# Patient Record
Sex: Female | Born: 1970 | Race: White | Hispanic: No | Smoking: Never smoker
Health system: Southern US, Community
[De-identification: ages and names within clinical notes are randomized; demographics above are authoritative.]

## PROBLEM LIST (undated history)

## (undated) DIAGNOSIS — T4145XA Adverse effect of unspecified anesthetic, initial encounter: Secondary | ICD-10-CM

## (undated) DIAGNOSIS — F329 Major depressive disorder, single episode, unspecified: Secondary | ICD-10-CM

## (undated) DIAGNOSIS — E282 Polycystic ovarian syndrome: Secondary | ICD-10-CM

## (undated) DIAGNOSIS — F419 Anxiety disorder, unspecified: Secondary | ICD-10-CM

## (undated) DIAGNOSIS — T8859XA Other complications of anesthesia, initial encounter: Secondary | ICD-10-CM

## (undated) DIAGNOSIS — J189 Pneumonia, unspecified organism: Secondary | ICD-10-CM

## (undated) DIAGNOSIS — R519 Headache, unspecified: Secondary | ICD-10-CM

## (undated) DIAGNOSIS — M797 Fibromyalgia: Secondary | ICD-10-CM

## (undated) DIAGNOSIS — F32A Depression, unspecified: Secondary | ICD-10-CM

## (undated) DIAGNOSIS — Z87442 Personal history of urinary calculi: Secondary | ICD-10-CM

## (undated) HISTORY — PX: GASTRIC BYPASS: SHX52

## (undated) HISTORY — PX: DILATION AND CURETTAGE OF UTERUS: SHX78

---
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
## 2008-09-12 ENCOUNTER — Ambulatory Visit: Payer: Self-pay | Admitting: Diagnostic Radiology

## 2008-09-12 ENCOUNTER — Emergency Department (HOSPITAL_BASED_OUTPATIENT_CLINIC_OR_DEPARTMENT_OTHER): Admission: EM | Admit: 2008-09-12 | Discharge: 2008-09-12 | Payer: Self-pay | Admitting: Emergency Medicine

## 2009-04-05 HISTORY — PX: ROUX-EN-Y GASTRIC BYPASS: SHX1104

## 2009-08-04 ENCOUNTER — Ambulatory Visit (HOSPITAL_COMMUNITY): Admission: RE | Admit: 2009-08-04 | Discharge: 2009-08-04 | Payer: Self-pay | Admitting: Surgery

## 2009-08-08 ENCOUNTER — Ambulatory Visit (HOSPITAL_COMMUNITY): Admission: RE | Admit: 2009-08-08 | Discharge: 2009-08-08 | Payer: Self-pay | Admitting: Surgery

## 2009-09-11 ENCOUNTER — Encounter: Admission: RE | Admit: 2009-09-11 | Discharge: 2009-09-11 | Payer: Self-pay | Admitting: Surgery

## 2010-07-13 LAB — POCT CARDIAC MARKERS
Myoglobin, poc: 35 ng/mL (ref 12–200)
Troponin i, poc: <0.05 ng/mL (ref 0.00–0.09)

## 2010-07-13 LAB — URINALYSIS, ROUTINE W REFLEX MICROSCOPIC
Ketones, ur: NEGATIVE mg/dL
Nitrite: NEGATIVE
Specific Gravity, Urine: 1.024 (ref 1.005–1.030)

## 2010-07-13 LAB — BASIC METABOLIC PANEL
Calcium: 9.3 mg/dL (ref 8.4–10.5)
Chloride: 102 mEq/L (ref 96–112)
GFR calc non Af Amer: >60 mL/min (ref >60)
Glucose, Bld: 99 mg/dL (ref 70–99)
Potassium: 4.2 mEq/L (ref 3.5–5.1)
Sodium: 142 mEq/L (ref 135–145)

## 2010-07-13 LAB — DIFFERENTIAL
Basophils Absolute: 0 10*3/uL (ref 0.0–0.1)
Basophils Relative: 0 % (ref 0–1)
Eosinophils Absolute: 0.2 10*3/uL (ref 0.0–0.7)
Lymphocytes Relative: 26 % (ref 12–46)
Lymphs Abs: 1.8 10*3/uL (ref 0.7–4.0)
Monocytes Relative: 4 % (ref 3–12)
Neutrophils Relative %: 67 % (ref 43–77)

## 2010-07-13 LAB — CBC
Hemoglobin: 15.2 g/dL — ABNORMAL HIGH (ref 12.0–15.0)
MCHC: 35 g/dL (ref 30.0–36.0)
RBC: 4.74 MIL/uL (ref 3.87–5.11)

## 2010-07-13 LAB — PREGNANCY, URINE: Preg Test, Ur: NEGATIVE

## 2010-08-19 ENCOUNTER — Other Ambulatory Visit: Payer: Self-pay | Admitting: Family Medicine

## 2010-08-19 DIAGNOSIS — N63 Unspecified lump in unspecified breast: Secondary | ICD-10-CM

## 2010-08-24 ENCOUNTER — Ambulatory Visit
Admission: RE | Admit: 2010-08-24 | Discharge: 2010-08-24 | Disposition: A | Discharge status: Home / Self Care | Payer: BC Managed Care – PPO | Source: Ambulatory Visit | Attending: Family Medicine | Admitting: Family Medicine

## 2010-08-24 ENCOUNTER — Other Ambulatory Visit: Payer: Self-pay | Admitting: Family Medicine

## 2010-08-24 DIAGNOSIS — N63 Unspecified lump in unspecified breast: Secondary | ICD-10-CM

## 2011-03-03 ENCOUNTER — Other Ambulatory Visit: Payer: Self-pay | Admitting: Family Medicine

## 2011-03-03 DIAGNOSIS — M25512 Pain in left shoulder: Secondary | ICD-10-CM

## 2011-03-06 ENCOUNTER — Other Ambulatory Visit: Payer: BC Managed Care – PPO

## 2011-03-08 ENCOUNTER — Ambulatory Visit
Admission: RE | Admit: 2011-03-08 | Discharge: 2011-03-08 | Disposition: A | Discharge status: Home / Self Care | Payer: BC Managed Care – PPO | Source: Ambulatory Visit | Attending: Family Medicine | Admitting: Family Medicine

## 2011-03-08 DIAGNOSIS — M25512 Pain in left shoulder: Secondary | ICD-10-CM

## 2014-09-10 ENCOUNTER — Ambulatory Visit (HOSPITAL_COMMUNITY)
Admission: RE | Admit: 2014-09-10 | Discharge: 2014-09-10 | Disposition: A | Discharge status: Home / Self Care | Payer: BLUE CROSS/BLUE SHIELD | Source: Ambulatory Visit | Attending: Family Medicine | Admitting: Family Medicine

## 2014-09-10 ENCOUNTER — Other Ambulatory Visit (HOSPITAL_COMMUNITY): Payer: Self-pay | Admitting: Family Medicine

## 2014-09-10 DIAGNOSIS — H532 Diplopia: Secondary | ICD-10-CM

## 2014-09-10 DIAGNOSIS — R42 Dizziness and giddiness: Secondary | ICD-10-CM | POA: Insufficient documentation

## 2019-04-04 ENCOUNTER — Emergency Department (HOSPITAL_COMMUNITY)
Admission: EM | Admit: 2019-04-04 | Discharge: 2019-04-05 | Disposition: A | Discharge status: Home / Self Care | Payer: BC Managed Care – PPO | Source: Home / Self Care | Attending: Emergency Medicine | Admitting: Emergency Medicine

## 2019-04-04 ENCOUNTER — Other Ambulatory Visit: Payer: Self-pay

## 2019-04-04 ENCOUNTER — Encounter (HOSPITAL_COMMUNITY): Payer: Self-pay

## 2019-04-04 ENCOUNTER — Emergency Department (HOSPITAL_COMMUNITY): Payer: BC Managed Care – PPO

## 2019-04-04 DIAGNOSIS — N201 Calculus of ureter: Secondary | ICD-10-CM

## 2019-04-04 DIAGNOSIS — R109 Unspecified abdominal pain: Secondary | ICD-10-CM | POA: Diagnosis present

## 2019-04-04 DIAGNOSIS — Z9884 Bariatric surgery status: Secondary | ICD-10-CM | POA: Diagnosis not present

## 2019-04-04 DIAGNOSIS — Z6841 Body Mass Index (BMI) 40.0 and over, adult: Secondary | ICD-10-CM | POA: Diagnosis not present

## 2019-04-04 DIAGNOSIS — E119 Type 2 diabetes mellitus without complications: Secondary | ICD-10-CM | POA: Diagnosis not present

## 2019-04-04 DIAGNOSIS — I10 Essential (primary) hypertension: Secondary | ICD-10-CM | POA: Diagnosis not present

## 2019-04-04 LAB — URINALYSIS, ROUTINE W REFLEX MICROSCOPIC
Bacteria, UA: NONE SEEN
Bilirubin Urine: NEGATIVE
Glucose, UA: NEGATIVE mg/dL
Ketones, ur: 5 mg/dL — AB
Leukocytes,Ua: NEGATIVE
Nitrite: NEGATIVE
Protein, ur: NEGATIVE mg/dL
Specific Gravity, Urine: >1.046 — ABNORMAL HIGH (ref 1.005–1.030)
pH: 5 (ref 5.0–8.0)

## 2019-04-04 LAB — COMPREHENSIVE METABOLIC PANEL
ALT: 18 U/L (ref 0–44)
AST: 22 U/L (ref 15–41)
Albumin: 4 g/dL (ref 3.5–5.0)
Alkaline Phosphatase: 55 U/L (ref 38–126)
Anion gap: 10 (ref 5–15)
BUN: 10 mg/dL (ref 6–20)
CO2: 22 mmol/L (ref 22–32)
Calcium: 9 mg/dL (ref 8.9–10.3)
Chloride: 107 mmol/L (ref 98–111)
Creatinine, Ser: 0.89 mg/dL (ref 0.44–1.00)
GFR calc Af Amer: >60 mL/min (ref >60)
GFR calc non Af Amer: >60 mL/min (ref >60)
Glucose, Bld: 149 mg/dL — ABNORMAL HIGH (ref 70–99)
Potassium: 4 mmol/L (ref 3.5–5.1)
Sodium: 139 mmol/L (ref 135–145)
Total Bilirubin: 1 mg/dL (ref 0.3–1.2)
Total Protein: 6.8 g/dL (ref 6.5–8.1)

## 2019-04-04 LAB — CBC
HCT: 40.6 % (ref 36.0–46.0)
Hemoglobin: 13.9 g/dL (ref 12.0–15.0)
MCH: 31.2 pg (ref 26.0–34.0)
MCHC: 34.2 g/dL (ref 30.0–36.0)
MCV: 91.2 fL (ref 80.0–100.0)
Platelets: 313 10*3/uL (ref 150–400)
RBC: 4.45 MIL/uL (ref 3.87–5.11)
RDW: 12.8 % (ref 11.5–15.5)
WBC: 10.7 10*3/uL — ABNORMAL HIGH (ref 4.0–10.5)
nRBC: 0 % (ref 0.0–0.2)

## 2019-04-04 LAB — LIPASE, BLOOD: Lipase: 23 U/L (ref 11–51)

## 2019-04-04 LAB — I-STAT BETA HCG BLOOD, ED (MC, WL, AP ONLY): I-stat hCG, quantitative: <5 m[IU]/mL (ref <5)

## 2019-04-04 MED ORDER — OXYCODONE-ACETAMINOPHEN 5-325 MG PO TABS: 1.0000 | ORAL_TABLET | Freq: Four times a day (QID) | ORAL | 0 refills | Status: DC | PRN

## 2019-04-04 MED ORDER — SODIUM CHLORIDE 0.9 % IV BOLUS
1000.0000 mL | Freq: Once | INTRAVENOUS | Status: AC
  Administered 2019-04-04: 1000 mL via INTRAVENOUS

## 2019-04-04 MED ORDER — ONDANSETRON 4 MG PO TBDP: 4.0000 mg | ORAL_TABLET | Freq: Three times a day (TID) | ORAL | 0 refills | Status: DC | PRN

## 2019-04-04 MED ORDER — ONDANSETRON HCL 4 MG/2ML IJ SOLN
4.0000 mg | Freq: Once | INTRAMUSCULAR | Status: AC
  Administered 2019-04-04: 22:00:00 4 mg via INTRAVENOUS
  Filled 2019-04-04: qty 2

## 2019-04-04 MED ORDER — KETOROLAC TROMETHAMINE 30 MG/ML IJ SOLN
30.0000 mg | Freq: Once | INTRAMUSCULAR | Status: AC
  Administered 2019-04-05: 30 mg via INTRAVENOUS
  Filled 2019-04-04: qty 1

## 2019-04-04 MED ORDER — IOHEXOL 300 MG/ML  SOLN
100.0000 mL | Freq: Once | INTRAMUSCULAR | Status: AC | PRN
  Administered 2019-04-04: 22:00:00 100 mL via INTRAVENOUS

## 2019-04-04 MED ORDER — TAMSULOSIN HCL 0.4 MG PO CAPS: 0.4000 mg | ORAL_CAPSULE | Freq: Every day | ORAL | 0 refills | Status: DC

## 2019-04-04 MED ORDER — OXYCODONE-ACETAMINOPHEN 5-325 MG PO TABS
1.0000 | ORAL_TABLET | ORAL | Status: DC | PRN
  Administered 2019-04-04: 1 via ORAL
  Filled 2019-04-04: qty 1

## 2019-04-04 MED ORDER — MORPHINE SULFATE (PF) 4 MG/ML IV SOLN
4.0000 mg | Freq: Once | INTRAVENOUS | Status: AC
  Administered 2019-04-04: 4 mg via INTRAVENOUS
  Filled 2019-04-04: qty 1

## 2019-04-04 NOTE — ED Provider Notes (Signed)
[facility] Provider Note   CSN: [id number] Arrival date & time: 04/04/19  1511     History Chief Complaint  Patient presents with  . Abdominal Pain    Nancy Cordova is a 48 y.o. female with past medical history of Roux-en-Y gastric bypass in 2011, presenting to the emergency department with complaint of left-sided abdominal pain that began suddenly this morning.  She states she had a brief episode a few days ago that only lasted a couple of hours that was across her abdomen however resolved.  She states today she had sudden onset of left flank and left mid abdomen pain that feels like a "cross between menstrual cramp and muscle cramp."  She states the pain has been constant and severe.  It has improved with Percocet that was administered in triage.  She reports associated nausea and vomiting as well as diarrhea occurred this morning.  She states she is not passed any flatus since the episode of diarrhea this morning.  She endorses chills though no fever.  She endorses increased frequency though no dysuria.  No history of nephrolithiasis or diverticulitis.  The history is provided by the patient.       History reviewed. No pertinent past medical history.  There are no problems to display for this patient.   Past Surgical History:  Procedure Laterality Date  . GASTRIC BYPASS       OB History   No obstetric history on file.     History reviewed. No pertinent family history.  Social History   Tobacco Use  . Smoking status: Not on file  Substance Use Topics  . Alcohol use: Not on file  . Drug use: Not on file    Home Medications Prior to Admission medications   Medication Sig Start Date End Date Taking? Authorizing Provider  ondansetron (ZOFRAN ODT) 4 MG disintegrating tablet Take 1 tablet (4 mg total) by mouth every 8 (eight) hours as needed for nausea or vomiting. 04/04/19   Nayelly Laughman, [place] N, PA-C  oxyCODONE-acetaminophen  (PERCOCET/ROXICET) 5-325 MG tablet Take 1 tablet by mouth every 6 (six) hours as needed for severe pain. 04/04/19   Jorryn Casagrande, [place] N, PA-C  tamsulosin (FLOMAX) 0.4 MG CAPS capsule Take 1 capsule (0.4 mg total) by mouth daily. 04/04/19   Jensyn Shave, [place] N, PA-C    Allergies    Patient has no known allergies.  Review of Systems   Review of Systems  Constitutional: Negative for fever.  Gastrointestinal: Positive for abdominal pain, diarrhea, nausea and vomiting.  Genitourinary: Positive for frequency.  All other systems reviewed and are negative.   Physical Exam Updated Vital Signs BP 126/68   Pulse 85   Temp 98.8 F (37.1 C) (Oral)   Resp 14   Ht 5\' 5"  (1.651 m)   Wt 108.9 kg   SpO2 100%   BMI 39.94 kg/m   Physical Exam Vitals and nursing note reviewed.  Constitutional:      General: She is not in acute distress.    Appearance: She is well-developed. She is not ill-appearing.  HENT:     Head: Normocephalic and atraumatic.  Eyes:     Conjunctiva/sclera: Conjunctivae normal.  Cardiovascular:     Rate and Rhythm: Normal rate and regular rhythm.  Pulmonary:     Effort: Pulmonary effort is normal. No respiratory distress.     Breath sounds: Normal breath sounds.  Abdominal:     General: Bowel sounds are normal.  Palpations: Abdomen is soft.     Tenderness: There is abdominal tenderness (left mid abdomen to left flank). There is no guarding or rebound.  Skin:    General: Skin is warm.  Neurological:     Mental Status: She is alert.  Psychiatric:        Behavior: Behavior normal.     ED Results / Procedures / Treatments   Labs (all labs ordered are listed, but only abnormal results are displayed) Labs Reviewed  COMPREHENSIVE METABOLIC PANEL - Abnormal; Notable for the following components:      Result Value   Glucose, Bld 149 (*)    All other components within normal limits  CBC - Abnormal; Notable for the following components:   WBC 10.7 (*)    All other  components within normal limits  LIPASE, BLOOD  URINALYSIS, ROUTINE W REFLEX MICROSCOPIC  I-STAT BETA HCG BLOOD, ED ([facility], [facility], [facility] ONLY)    EKG None  Radiology CT Abdomen Pelvis W Contrast  Result Date: 04/04/2019 CLINICAL DATA:  Left-sided abdominal pain. Nausea, vomiting, diarrhea. History of gastric bypass. EXAM: CT ABDOMEN AND PELVIS WITH CONTRAST TECHNIQUE: Multidetector CT imaging of the abdomen and pelvis was performed using the standard protocol following bolus administration of intravenous contrast. CONTRAST:  OMNIPAQUE IOHEXOL 300 MG/ML  SOLN COMPARISON:  None. FINDINGS: Lower chest: Minimal dependent opacities in both lung bases favoring atelectasis. No pleural fluid. Heart is normal in size. Hepatobiliary: No focal liver abnormality is seen. No gallstones, gallbladder wall thickening, or biliary dilatation. Pancreas: No ductal dilatation or inflammation. Spleen: Normal in size without focal abnormality. Adrenals/Urinary Tract: Adrenal glands. Obstructing 9 x 11 mm stone in the left proximal ureter just distal to the ureteropelvic junction with moderate left hydronephrosis and perinephric edema. Small amount of left perinephric fluid. More distal ureter is decompressed. Absent left excretion on delayed phase imaging. Homogeneous enhancement of the right kidney without right hydronephrosis. No evidence of additional nonobstructing calculi in either kidney. Urinary bladder is partially distended, no bladder wall thickening or stone. Stomach/Bowel: Gastric bypass anatomy. Roux limb is nondilated. Excluded gastric remnant is decompressed. No bowel obstruction or inflammation. Small volume of colonic stool. Normal appendix. Vascular/Lymphatic: Abdominal aorta is normal in caliber. The portal vein is patent. No acute vascular findings. No enlarged lymph nodes in the abdomen or pelvis. Reproductive: Intrauterine device in the uterus. Ovaries symmetric in size. No adnexal mass. Other: No free air  or intra-abdominal abscess. Musculoskeletal: There are no acute or suspicious osseous abnormalities. IMPRESSION: 1. Obstructing 9 x 11 mm stone in the left proximal ureter with moderate hydronephrosis and perinephric edema. 2. Post gastric bypass without complication. Electronically Signed   By: Narda Rutherford M.D.   On: 04/04/2019 22:58    Procedures Procedures (including critical care time)  Medications Ordered in ED Medications  oxyCODONE-acetaminophen (PERCOCET/ROXICET) 5-325 MG per tablet 1 tablet (1 tablet Oral Given 04/04/19 1547)  ketorolac (TORADOL) 30 MG/ML injection 30 mg (has no administration in time range)  ondansetron (ZOFRAN) injection 4 mg (4 mg Intravenous Given 04/04/19 2147)  sodium chloride 0.9 % bolus 1,000 mL (1,000 mLs Intravenous New Bag/Given 04/04/19 2147)  morphine 4 MG/ML injection 4 mg (4 mg Intravenous Given 04/04/19 2147)  iohexol (OMNIPAQUE) 300 MG/ML solution 100 mL (100 mLs Intravenous Contrast Given 04/04/19 2209)    ED Course  I have reviewed the triage vital signs and the nursing notes.  Pertinent labs & imaging results that were available during my care of the  patient were reviewed by me and considered in my medical decision making (see chart for details).  Clinical Course as of Apr 03 2350  Wed Apr 04, 2019  2312 Discussed with urology. Pt to call clinic in morning for follow up.   [JR]    Clinical Course User Index [JR] Linsi Humann, [place] N, PA-C   MDM Rules/Calculators/A&P                      Pt presenting with left abdominal/flank pain with assoc N/V and inc urinary frequency. Diagnosis of 9x64mm proximal ureteral stone with moderate hydronephrosis made via CT. Serum creatine WNL, vitals sign stable and the pt does not have intractable vomiting.  Patient symptoms well managed in the ED.  Pending urinalysis, care assumed at shift change by PA McDonald.  Plan to follow-up UA and discharged with symptomatic management, patient instructed to  call urology in the morning for close follow-up as she is unlikely to pass this on her own.  Patient is agreeable to plan at this time.  [place] Controlled Substance reporting System queried  Final Clinical Impression(s) / ED Diagnoses Final diagnoses:  Left ureteral stone    Rx / DC Orders ED Discharge Orders         Ordered    oxyCODONE-acetaminophen (PERCOCET/ROXICET) 5-325 MG tablet  Every 6 hours PRN     04/04/19 2349    ondansetron (ZOFRAN ODT) 4 MG disintegrating tablet  Every 8 hours PRN     04/04/19 2349    tamsulosin (FLOMAX) 0.4 MG CAPS capsule  Daily     04/04/19 2349           Colten Desroches, [place] N, PA-C 04/04/19 2351    Lucrezia Starch, MD 04/05/19 1527

## 2019-04-04 NOTE — Discharge Instructions (Addendum)
Please read instructions below. Drink plenty of water. You can take Percocet every 6 hours as needed for pain. You can take Zofran every 6 hours as needed for nausea. Take flomax once per day for bladder spasm. You can also take ibuprofen every 6 hours. Call the Urology office first thing in the morning to schedule close follow up, as it is unlikely you will pass this stone on your own due to its size.  Return to the [facility] ER for fever, uncontrollable  pain, uncontrollable vomiting, or worsening symptoms.

## 2019-04-04 NOTE — ED Triage Notes (Signed)
Pt arrives POV for eval of L sided flank pain radiating around to lower abd onset this AM at 1000 w/ associated N/V/D. Pt has hx of Roux en Y Gastric Bypass in 2011. Denies dysuria, hematuria. Frequency. Denies fevers at home

## 2019-04-04 NOTE — ED Notes (Signed)
Pt stated she cannot provide urine sample right now.  

## 2019-04-05 ENCOUNTER — Ambulatory Visit (HOSPITAL_COMMUNITY): Payer: BC Managed Care – PPO | Admitting: Certified Registered Nurse Anesthetist

## 2019-04-05 ENCOUNTER — Ambulatory Visit (HOSPITAL_COMMUNITY): Payer: BC Managed Care – PPO

## 2019-04-05 ENCOUNTER — Other Ambulatory Visit (HOSPITAL_COMMUNITY)
Admission: RE | Admit: 2019-04-05 | Discharge: 2019-04-05 | Disposition: A | Discharge status: Home / Self Care | Payer: BC Managed Care – PPO | Source: Ambulatory Visit | Attending: Urology | Admitting: Urology

## 2019-04-05 ENCOUNTER — Other Ambulatory Visit: Payer: Self-pay

## 2019-04-05 ENCOUNTER — Ambulatory Visit (HOSPITAL_COMMUNITY)
Admission: AD | Admit: 2019-04-05 | Discharge: 2019-04-05 | Disposition: A | Discharge status: Home / Self Care | Payer: BC Managed Care – PPO | Source: Other Acute Inpatient Hospital | Attending: Urology | Admitting: Urology

## 2019-04-05 ENCOUNTER — Encounter (HOSPITAL_COMMUNITY)
Admission: AD | Disposition: A | Discharge status: Home / Self Care | Payer: Self-pay | Source: Other Acute Inpatient Hospital | Attending: Urology

## 2019-04-05 ENCOUNTER — Other Ambulatory Visit: Payer: Self-pay | Admitting: Urology

## 2019-04-05 ENCOUNTER — Encounter (HOSPITAL_COMMUNITY): Payer: Self-pay | Admitting: Urology

## 2019-04-05 DIAGNOSIS — I10 Essential (primary) hypertension: Secondary | ICD-10-CM | POA: Insufficient documentation

## 2019-04-05 DIAGNOSIS — N201 Calculus of ureter: Secondary | ICD-10-CM | POA: Insufficient documentation

## 2019-04-05 DIAGNOSIS — E119 Type 2 diabetes mellitus without complications: Secondary | ICD-10-CM | POA: Insufficient documentation

## 2019-04-05 DIAGNOSIS — Z6841 Body Mass Index (BMI) 40.0 and over, adult: Secondary | ICD-10-CM | POA: Insufficient documentation

## 2019-04-05 DIAGNOSIS — Z9884 Bariatric surgery status: Secondary | ICD-10-CM | POA: Insufficient documentation

## 2019-04-05 HISTORY — DX: Anxiety disorder, unspecified: F41.9

## 2019-04-05 HISTORY — DX: Fibromyalgia: M79.7

## 2019-04-05 HISTORY — PX: CYSTOSCOPY W/ URETERAL STENT PLACEMENT: SHX1429

## 2019-04-05 HISTORY — DX: Personal history of urinary calculi: Z87.442

## 2019-04-05 HISTORY — DX: Pneumonia, unspecified organism: J18.9

## 2019-04-05 HISTORY — DX: Headache, unspecified: R51.9

## 2019-04-05 HISTORY — DX: Depression, unspecified: F32.A

## 2019-04-05 LAB — RESPIRATORY PANEL BY RT PCR (FLU A&B, COVID)
Influenza A by PCR: NEGATIVE
Influenza B by PCR: NEGATIVE
SARS Coronavirus 2 by RT PCR: NEGATIVE

## 2019-04-05 SURGERY — CYSTOSCOPY, FLEXIBLE, WITH STENT REPLACEMENT
Anesthesia: General | Laterality: Left

## 2019-04-05 MED ORDER — KETOROLAC TROMETHAMINE 30 MG/ML IJ SOLN
INTRAMUSCULAR | Status: AC
  Filled 2019-04-05: qty 1

## 2019-04-05 MED ORDER — MIDAZOLAM HCL 2 MG/2ML IJ SOLN
INTRAMUSCULAR | Status: AC
  Filled 2019-04-05: qty 2

## 2019-04-05 MED ORDER — ONDANSETRON HCL 4 MG/2ML IJ SOLN
INTRAMUSCULAR | Status: AC
  Filled 2019-04-05: qty 2

## 2019-04-05 MED ORDER — SENNOSIDES-DOCUSATE SODIUM 8.6-50 MG PO TABS: 2.0000 | ORAL_TABLET | Freq: Every day | ORAL | 1 refills | Status: AC | PRN

## 2019-04-05 MED ORDER — FENTANYL CITRATE (PF) 100 MCG/2ML IJ SOLN
INTRAMUSCULAR | Status: DC | PRN
  Administered 2019-04-05 (×2): 50 ug via INTRAVENOUS

## 2019-04-05 MED ORDER — LIDOCAINE 2% (20 MG/ML) 5 ML SYRINGE
INTRAMUSCULAR | Status: DC | PRN
  Administered 2019-04-05: 100 mg via INTRAVENOUS

## 2019-04-05 MED ORDER — PROPOFOL 10 MG/ML IV BOLUS
INTRAVENOUS | Status: AC
  Filled 2019-04-05: qty 20

## 2019-04-05 MED ORDER — ONDANSETRON HCL 4 MG/2ML IJ SOLN
INTRAMUSCULAR | Status: DC | PRN
  Administered 2019-04-05: 4 mg via INTRAVENOUS

## 2019-04-05 MED ORDER — 0.9 % SODIUM CHLORIDE (POUR BTL) OPTIME
TOPICAL | Status: DC | PRN
  Administered 2019-04-05: 1000 mL

## 2019-04-05 MED ORDER — FENTANYL CITRATE (PF) 100 MCG/2ML IJ SOLN: 25.0000 ug | INTRAMUSCULAR | Status: DC | PRN

## 2019-04-05 MED ORDER — DEXAMETHASONE SODIUM PHOSPHATE 10 MG/ML IJ SOLN
INTRAMUSCULAR | Status: AC
  Filled 2019-04-05: qty 1

## 2019-04-05 MED ORDER — KETOROLAC TROMETHAMINE 30 MG/ML IJ SOLN
INTRAMUSCULAR | Status: DC | PRN
  Administered 2019-04-05: 30 mg via INTRAVENOUS

## 2019-04-05 MED ORDER — DEXAMETHASONE SODIUM PHOSPHATE 10 MG/ML IJ SOLN
INTRAMUSCULAR | Status: DC | PRN
  Administered 2019-04-05: 10 mg via INTRAVENOUS

## 2019-04-05 MED ORDER — PHENYLEPHRINE 40 MCG/ML (10ML) SYRINGE FOR IV PUSH (FOR BLOOD PRESSURE SUPPORT)
PREFILLED_SYRINGE | INTRAVENOUS | Status: DC | PRN
  Administered 2019-04-05: 120 ug via INTRAVENOUS

## 2019-04-05 MED ORDER — PROPOFOL 10 MG/ML IV BOLUS
INTRAVENOUS | Status: AC
  Filled 2019-04-05: qty 40

## 2019-04-05 MED ORDER — FENTANYL CITRATE (PF) 100 MCG/2ML IJ SOLN
INTRAMUSCULAR | Status: AC
  Filled 2019-04-05: qty 2

## 2019-04-05 MED ORDER — SCOPOLAMINE 1 MG/3DAYS TD PT72
1.0000 | MEDICATED_PATCH | TRANSDERMAL | Status: DC
  Administered 2019-04-05: 15:00:00 1.5 mg via TRANSDERMAL
  Filled 2019-04-05: qty 1

## 2019-04-05 MED ORDER — OXYBUTYNIN CHLORIDE 5 MG PO TABS: 5.0000 mg | ORAL_TABLET | Freq: Three times a day (TID) | ORAL | 0 refills | Status: DC | PRN

## 2019-04-05 MED ORDER — CEFAZOLIN SODIUM-DEXTROSE 2-4 GM/100ML-% IV SOLN
2.0000 g | Freq: Once | INTRAVENOUS | Status: AC
  Administered 2019-04-05: 2 g via INTRAVENOUS
  Filled 2019-04-05: qty 100

## 2019-04-05 MED ORDER — PROPOFOL 10 MG/ML IV BOLUS
INTRAVENOUS | Status: DC | PRN
  Administered 2019-04-05: 200 mg via INTRAVENOUS

## 2019-04-05 MED ORDER — MIDAZOLAM HCL 5 MG/5ML IJ SOLN
INTRAMUSCULAR | Status: DC | PRN
  Administered 2019-04-05: 2 mg via INTRAVENOUS

## 2019-04-05 MED ORDER — LACTATED RINGERS IV SOLN: INTRAVENOUS | Status: DC

## 2019-04-05 MED ORDER — SODIUM CHLORIDE 0.9 % IR SOLN
Status: DC | PRN
  Administered 2019-04-05: 3000 mL

## 2019-04-05 MED ORDER — PROMETHAZINE HCL 25 MG/ML IJ SOLN: 6.2500 mg | INTRAMUSCULAR | Status: DC | PRN

## 2019-04-05 MED ORDER — DIPHENHYDRAMINE HCL 50 MG/ML IJ SOLN
INTRAMUSCULAR | Status: AC
  Filled 2019-04-05: qty 1

## 2019-04-05 MED ORDER — ACETAMINOPHEN 500 MG PO TABS
1000.0000 mg | ORAL_TABLET | Freq: Once | ORAL | Status: AC
  Administered 2019-04-05: 1000 mg via ORAL
  Filled 2019-04-05: qty 2

## 2019-04-05 MED ORDER — NITROFURANTOIN MONOHYD MACRO 100 MG PO CAPS: 100.0000 mg | ORAL_CAPSULE | Freq: Two times a day (BID) | ORAL | 0 refills | Status: AC

## 2019-04-05 SURGICAL SUPPLY — 13 items
BAG URO CATCHER STRL LF (MISCELLANEOUS) ×2 IMPLANT
CATH INTERMIT  6FR 70CM (CATHETERS) ×2 IMPLANT
CLOTH BEACON ORANGE TIMEOUT ST (SAFETY) ×2 IMPLANT
GLOVE BIO SURGEON STRL SZ 6 (GLOVE) ×2 IMPLANT
GOWN SPEC L3 MED W/TWL (GOWN DISPOSABLE) ×2 IMPLANT
GOWN STRL REUS W/TWL LRG LVL3 (GOWN DISPOSABLE) ×4 IMPLANT
GUIDEWIRE STR DUAL SENSOR (WIRE) ×2 IMPLANT
KIT TURNOVER KIT A (KITS) IMPLANT
MANIFOLD NEPTUNE II (INSTRUMENTS) ×2 IMPLANT
PACK CYSTO (CUSTOM PROCEDURE TRAY) ×2 IMPLANT
STENT URET 6FRX26 CONTOUR (STENTS) ×2 IMPLANT
TUBING CONNECTING 10 (TUBING) ×2 IMPLANT
TUBING UROLOGY SET (TUBING) IMPLANT

## 2019-04-05 NOTE — Transfer of Care (Signed)
Immediate Anesthesia Transfer of Care Note  Patient: Nancy Cordova  Procedure(s) Performed: CYSTOSCOPY RETROGRADE WITH LEFT STENT REPLACEMENT (Left )  Patient Location: PACU  Anesthesia Type:General  Level of Consciousness: awake, sedated and patient cooperative  Airway & Oxygen Therapy: Patient Spontanous Breathing and Patient connected to face mask oxygen  Post-op Assessment: Report given to RN and Post -op Vital signs reviewed and stable  Post vital signs: Reviewed and stable  Last Vitals:  Vitals Value Taken Time  BP 113/62 04/05/19 1630  Temp    Pulse 76 04/05/19 1633  Resp 19 04/05/19 1633  SpO2 100 % 04/05/19 1633  Vitals shown include unvalidated device data.  Last Pain:  Vitals:   04/05/19 1425  TempSrc: Oral  PainSc: 0-No pain      Patients Stated Pain Goal: 3 (50/35/46 5681)  Complications: No apparent anesthesia complications

## 2019-04-05 NOTE — Discharge Instructions (Signed)

## 2019-04-05 NOTE — Anesthesia Preprocedure Evaluation (Addendum)
Anesthesia Evaluation  Patient identified by MRN, date of birth, ID band Patient awake    Reviewed: Allergy & Precautions, NPO status , Patient's Chart, lab work & pertinent test results, reviewed documented beta blocker date and time   Airway Mallampati: III   Neck ROM: Full    Dental no notable dental hx. (+) Dental Advisory Given   Pulmonary neg pulmonary ROS,    Pulmonary exam normal        Cardiovascular negative cardio ROS Normal cardiovascular exam     Neuro/Psych PSYCHIATRIC DISORDERS Anxiety Depression    GI/Hepatic negative GI ROS, Neg liver ROS,   Endo/Other  Morbid obesity  Renal/GU      Musculoskeletal negative musculoskeletal ROS (+)   Abdominal   Peds  Hematology negative hematology ROS (+)   Anesthesia Other Findings Day of surgery medications reviewed with the patient.  Reproductive/Obstetrics                            Anesthesia Physical Anesthesia Plan  ASA: III  Anesthesia Plan: General   Post-op Pain Management:    Induction: Intravenous  PONV Risk Score and Plan: 3 and Ondansetron, Midazolam and Scopolamine patch - Pre-op  Airway Management Planned: LMA  Additional Equipment:   Intra-op Plan:   Post-operative Plan: Extubation in OR  Informed Consent: I have reviewed the patients History and Physical, chart, labs and discussed the procedure including the risks, benefits and alternatives for the proposed anesthesia with the patient or authorized representative who has indicated his/her understanding and acceptance.     Dental advisory given  Plan Discussed with: Anesthesiologist and CRNA  Anesthesia Plan Comments:        Anesthesia Quick Evaluation

## 2019-04-05 NOTE — Anesthesia Postprocedure Evaluation (Signed)
Anesthesia Post Note  Patient: Nancy Cordova  Procedure(s) Performed: CYSTOSCOPY RETROGRADE WITH LEFT STENT REPLACEMENT (Left )     Patient location during evaluation: PACU Anesthesia Type: General Level of consciousness: sedated Pain management: pain level controlled Vital Signs Assessment: post-procedure vital signs reviewed and stable Respiratory status: spontaneous breathing and respiratory function stable Cardiovascular status: stable Postop Assessment: no apparent nausea or vomiting Anesthetic complications: no    Last Vitals:  Vitals:   04/05/19 1700 04/05/19 1715  BP: 110/72 119/64  Pulse:    Resp:  16  Temp:  36.6 C  SpO2: 98% 99%    Last Pain:  Vitals:   04/05/19 1715  TempSrc:   PainSc: 0-No pain                 Amberli Ruegg DANIEL

## 2019-04-05 NOTE — Interval H&P Note (Signed)
History and Physical Interval Note:  04/05/2019 3:03 PM  Nancy Cordova  has presented today for surgery, with the diagnosis of LEFT UROLITHIASIS.  The various methods of treatment have been discussed with the patient and family. After consideration of risks, benefits and other options for treatment, the patient has consented to  Procedure(s): CYSTOSCOPY RETROGRADE WITH LEFT STENT REPLACEMENT (Left) as a surgical intervention.  The patient's history has been reviewed, patient examined, no change in status, stable for surgery.  I have reviewed the patient's chart and labs.  Questions were answered to the patient's satisfaction.     Sabryn Preslar D Guynell Kleiber

## 2019-04-05 NOTE — Anesthesia Procedure Notes (Signed)
Procedure Name: LMA Insertion Date/Time: 04/05/2019 4:00 PM Performed by: Silas Sacramento, CRNA Pre-anesthesia Checklist: Patient identified, Emergency Drugs available, Suction available and Patient being monitored Patient Re-evaluated:Patient Re-evaluated prior to induction Oxygen Delivery Method: Circle system utilized Preoxygenation: Pre-oxygenation with 100% oxygen Induction Type: IV induction LMA: LMA flexible inserted LMA Size: 4.0 Tube type: Oral Number of attempts: 1 Placement Confirmation: positive ETCO2 and breath sounds checked- equal and bilateral Tube secured with: Tape Dental Injury: Teeth and Oropharynx as per pre-operative assessment

## 2019-04-05 NOTE — Op Note (Signed)
PATIENT:  Nancy Cordova  Preoperative diagnosis:  1. Left UPJ calculus  Postoperative diagnosis:  1. Left UPJ calculus  Procedure:  1. Cystoscopy 2. left ureteral stent placement  (6Fr x 26cm)   Surgeon: Jacalyn Lefevre, M.D.  Anesthesia: General  Complications: None  EBL: Minimal  Specimens: None  Indication: 48 yo woman with 1cm left UPJ calculus and signs of infection on UA also with fever.   Description of procedure:  The patient was taken to the operating room and general anesthesia was induced.  The patient was placed in the dorsal lithotomy position, prepped and draped in the usual sterile fashion, and preoperative antibiotics were administered. A preoperative time-out was performed.   Cystourethroscopy was performed.  The patient's urethra was examined and was normal. The bladder was then systematically examined in its entirety. There was no evidence for any bladder tumors, stones, or other mucosal pathology.    A 0.038 sensor guidewire was then advanced up the left ureter into the renal pelvis under fluoroscopic guidance.  The wire was then backloaded through the cystoscope and a ureteral stent was advance over the wire using Seldinger technique.  The stent was positioned appropriately under fluoroscopic and cystoscopic guidance.  The wire was then removed with an adequate stent curl noted in the renal pelvis as well as in the bladder.  The bladder was then emptied and the procedure ended.  The patient appeared to tolerate the procedure well and without complications.  The patient was able to be awakened and transferred to the recovery unit in satisfactory condition.   FOLLOW UP: Patient will need to be scheduled for definitive kidney stone surgery in 2-3 weeks.

## 2019-04-05 NOTE — ED Provider Notes (Signed)
48 year old female received at sign out from [place] pending UA. Per her HPI:   "Minsa Weddington is a 48 y.o. female with past medical history of Roux-en-Y gastric bypass in 2011, presenting to the emergency department with complaint of left-sided abdominal pain that began suddenly this morning. She states she had a brief episode a few days ago that only lasted a couple of hours that was across her abdomen however resolved. She states today she had sudden onset of left flank and left mid abdomen pain that feels like a "cross between menstrual cramp and muscle cramp." She states the pain has been constant and severe. It has improved with Percocet that was administered in triage. She reports associated nausea and vomiting as well as diarrhea occurred this morning. She states she is not passed any flatus since the episode of diarrhea this morning. She endorses chills though no fever. She endorses increased frequency though no dysuria. No history of nephrolithiasis or diverticulitis.  The history is provided by the patient."  Physical Exam  BP 103/62 (BP Location: Right Arm)   Pulse 91   Temp 98.1 F (36.7 C) (Oral)   Resp 15   Ht 5\' 5"  (1.651 m)   Wt 108.9 kg   SpO2 97%   BMI 39.94 kg/m   Physical Exam Vitals and nursing note reviewed.  Constitutional:      General: She is not in acute distress.    Comments: Resting comfortably in bed and in no acute distress.  HENT:     Head: Normocephalic.  Eyes:     Conjunctiva/sclera: Conjunctivae normal.  Cardiovascular:     Rate and Rhythm: Normal rate and regular rhythm.     Heart sounds: No murmur. No friction rub. No gallop.   Pulmonary:     Effort: Pulmonary effort is normal. No respiratory distress.  Abdominal:     General: There is no distension.     Palpations: Abdomen is soft.  Musculoskeletal:     Cervical back: Neck supple.  Skin:    General: Skin is warm.     Findings: No rash.  Neurological:     Mental Status: She  is alert.  Psychiatric:        Behavior: Behavior normal.     ED Course/Procedures   Clinical Course as of Apr 05 203  Wed Apr 04, 2019  2312 Discussed with urology. Pt to call clinic in morning for follow up.   [JR]    Clinical Course User Index [JR] Robinson, [place] N, PA-C    Procedures  MDM   48 year old female received at signout from [place] pending urinalysis.  Please see her note for further work-up and evaluation.  The patient has an obstructive kidney stone that is approximately 9x11 mm in size.  Her pain has been well controlled UA is not concerning for infection.  These findings were discussed with the patient.  She was advised to call urology in the morning to schedule a follow-up appointment.  Flomax and pain medication have already been E prescribed by PA Quentin Cornwall.  The patient was advised that if she had new or worsening symptoms that she should go to [facility] emergency department since she has a known obstructive kidney stone.  All questions answered.  She is hemodynamically stable and in no acute distress.  Safe for discharge to home with outpatient follow-up with urology.      Joanne Gavel, PA-C 04/05/19 8099    Merryl Hacker, MD 04/05/19  0711  

## 2019-04-05 NOTE — H&P (Signed)
CC/HPI: cc: Left urolithiasis   04/05/19: 48 year old woman presented to the ER yesterday with severe left flank pain radiating to her abdomen. CT scan showed a 1 cm proximal ureteral calculus. She reports chills and severe emesis. No known fever. The pain medication is helping. Urinalysis today is consistent with infection with many bacteria and wbc's 10-20. This is patient's 1st kidney stone episode. She does have a history of a gastric bypass procedure.     ALLERGIES: Penicillins Sulfa Drugs    MEDICATIONS: Calcium + D TABS Oral  Multi-Vitamin TABS Oral     GU PSH: None     PSH Notes: Foot Surgery, Gastric Surgery Roux-en-Y   NON-GU PSH: Gastric Bypass For Obesity - 2013     GU PMH: Chronic cystitis (w/o hematuria), Chronic cystitis - 2014 Personal Hx Oth female genital tract diseases, History of polycystic ovarian syndrome - 2014      PMH Notes:  1898-04-05 00:00:00 - Note: Normal Routine History And Physical Adult   NON-GU PMH: Anxiety, Anxiety (Symptom) - 2014 Gastric ulcer, unspecified as acute or chronic, without hemorrhage or perforation, Gastric Ulcer - 2014 Personal history of other diseases of the circulatory system, History of hypertension - 2014 Personal history of other diseases of the digestive system, History of esophageal reflux - 2014 Personal history of other endocrine, nutritional and metabolic disease, History of diabetes mellitus - 2014 Personal history of other mental and behavioral disorders, History of depression - 2014    FAMILY HISTORY: Bladder Cancer - Grandmother Diabetes - Runs In Family Family Health Status Number - Runs In Family Kidney Cancer - Grandmother nephrolithiasis - Father Stroke Syndrome - Runs In Family   SOCIAL HISTORY: Marital Status: Divorced Preferred Language: English; Ethnicity: Not Hispanic Or Latino; Race: White Current Smoking Status: Patient has never smoked.   Tobacco Use Assessment Completed: Used Tobacco in last  30 days? Social Drinker.  Drinks 4+ caffeinated drinks per day.     Notes: Caffeine Use, Alcohol Use, Tobacco Use, Occupation:, Marital History - Divorced, Never A Smoker   REVIEW OF SYSTEMS:    GU Review Female:   Patient reports frequent urination, hard to postpone urination, burning /pain with urination, get up at night to urinate, and leakage of urine. Patient denies stream starts and stops, trouble starting your stream, have to strain to urinate, and being pregnant.  Gastrointestinal (Upper):   Patient reports nausea and vomiting. Patient denies indigestion/ heartburn.  Gastrointestinal (Lower):   Patient reports diarrhea. Patient denies constipation.  Constitutional:   Patient reports fatigue. Patient denies fever, night sweats, and weight loss.  Skin:   Patient denies skin rash/ lesion and itching.  Eyes:   Patient denies blurred vision and double vision.  Ears/ Nose/ Throat:   Patient denies sore throat and sinus problems.  Hematologic/Lymphatic:   Patient denies swollen glands and easy bruising.  Cardiovascular:   Patient denies leg swelling and chest pains.  Respiratory:   Patient reports cough. Patient denies shortness of breath.  Endocrine:   Patient denies excessive thirst.  Musculoskeletal:   Patient reports back pain. Patient denies joint pain.  Neurological:   Patient reports headaches and dizziness.   Psychologic:   Patient denies depression and anxiety.   Notes: weak stream, down to middle of back and left flank pain     VITAL SIGNS:      04/05/2019 10:40 AM  Weight 240 lb / 108.86 kg  Height 65 in / 165.1 cm  BP 107/70 mmHg  Pulse  65 /min  Temperature 97.5 F / 36.3 C  BMI 39.9 kg/m   MULTI-SYSTEM PHYSICAL EXAMINATION:    Constitutional: Well-nourished. No physical deformities. Normally developed. Good grooming.  Neck: Neck symmetrical, not swollen. Normal tracheal position.  Respiratory: No labored breathing, no use of accessory muscles.   Cardiovascular:  Normal temperature  Skin: No paleness, no jaundice, no cyanosis. No lesion, no ulcer, no rash.  Neurologic / Psychiatric: Oriented to time, oriented to place, oriented to person. No depression, no anxiety, no agitation.  Gastrointestinal: No mass, no tenderness, no rigidity, non obese abdomen.  Eyes: Normal conjunctivae. Normal eyelids.  Ears, Nose, Mouth, and Throat: Left ear no scars, no lesions, no masses. Right ear no scars, no lesions, no masses. Nose no scars, no lesions, no masses. Normal hearing. Normal lips.  Musculoskeletal: Normal gait and station of head and neck.     PAST DATA REVIEWED:  Source Of History:  Patient  Records Review:   Previous Patient Records  X-Ray Review: C.T. Abdomen/Pelvis: Reviewed Films. Discussed With Patient.     PROCEDURES:          Urinalysis w/Scope Dipstick Dipstick Cont'd Micro  Color: Amber Bilirubin: Neg mg/dL WBC/hpf: 10 - 20/hpf  Appearance: Cloudy Ketones: Neg mg/dL RBC/hpf: 20 - 40/hpf  Specific Gravity: 1.030 Blood: 3+ ery/uL Bacteria: Many (>50/hpf)  pH: 5.5 Protein: 2+ mg/dL Cystals: NS (Not Seen)  Glucose: Neg mg/dL Urobilinogen: 0.2 mg/dL Casts: NS (Not Seen)    Nitrites: Neg Trichomonas: Not Present    Leukocyte Esterase: Trace leu/uL Mucous: Not Present      Epithelial Cells: 0 - 5/hpf      Yeast: NS (Not Seen)      Sperm: Not Present    Notes: MICROSCOPIC NOT CONCENTRATED.    ASSESSMENT:      ICD-10 Details  1 GU:   Ureteral calculus - N20.1 Based on the size of kidney stone and patient's urinalysis and recommend cystoscopy with stent placement today. Her urinalysis concerning for infection and 1 cm stone will likely not pass on its own. I discussed the risks benefits of a cystoscopy with stent placement and patient has agreed to proceed. Patient understands this will be a staged procedure and she will need definitive stone management in approximately 2-3 weeks.   PLAN:           Document Letter(s):  Created for Patient:  Clinical Summary         Notes:   cc: Mercie Eon, NP    Signed by Jacalyn Lefevre, M.D. on 04/05/19 at 11:40 AM (EST)

## 2019-04-05 NOTE — H&P (View-Only) (Signed)
CC/HPI: cc: Left urolithiasis   04/05/19: 48 year old woman presented to the ER yesterday with severe left flank pain radiating to her abdomen. CT scan showed a 1 cm proximal ureteral calculus. She reports chills and severe emesis. No known fever. The pain medication is helping. Urinalysis today is consistent with infection with many bacteria and wbc's 10-20. This is patient's 1st kidney stone episode. She does have a history of a gastric bypass procedure.     ALLERGIES: Penicillins Sulfa Drugs    MEDICATIONS: Calcium + D TABS Oral  Multi-Vitamin TABS Oral     GU PSH: None     PSH Notes: Foot Surgery, Gastric Surgery Roux-en-Y   NON-GU PSH: Gastric Bypass For Obesity - 2013     GU PMH: Chronic cystitis (w/o hematuria), Chronic cystitis - 2014 Personal Hx Oth female genital tract diseases, History of polycystic ovarian syndrome - 2014      PMH Notes:  1898-04-05 00:00:00 - Note: Normal Routine History And Physical Adult   NON-GU PMH: Anxiety, Anxiety (Symptom) - 2014 Gastric ulcer, unspecified as acute or chronic, without hemorrhage or perforation, Gastric Ulcer - 2014 Personal history of other diseases of the circulatory system, History of hypertension - 2014 Personal history of other diseases of the digestive system, History of esophageal reflux - 2014 Personal history of other endocrine, nutritional and metabolic disease, History of diabetes mellitus - 2014 Personal history of other mental and behavioral disorders, History of depression - 2014    FAMILY HISTORY: Bladder Cancer - Grandmother Diabetes - Runs In Family Family Health Status Number - Runs In Family Kidney Cancer - Grandmother nephrolithiasis - Father Stroke Syndrome - Runs In Family   SOCIAL HISTORY: Marital Status: Divorced Preferred Language: English; Ethnicity: Not Hispanic Or Latino; Race: White Current Smoking Status: Patient has never smoked.   Tobacco Use Assessment Completed: Used Tobacco in last  30 days? Social Drinker.  Drinks 4+ caffeinated drinks per day.     Notes: Caffeine Use, Alcohol Use, Tobacco Use, Occupation:, Marital History - Divorced, Never A Smoker   REVIEW OF SYSTEMS:    GU Review Female:   Patient reports frequent urination, hard to postpone urination, burning /pain with urination, get up at night to urinate, and leakage of urine. Patient denies stream starts and stops, trouble starting your stream, have to strain to urinate, and being pregnant.  Gastrointestinal (Upper):   Patient reports nausea and vomiting. Patient denies indigestion/ heartburn.  Gastrointestinal (Lower):   Patient reports diarrhea. Patient denies constipation.  Constitutional:   Patient reports fatigue. Patient denies fever, night sweats, and weight loss.  Skin:   Patient denies skin rash/ lesion and itching.  Eyes:   Patient denies blurred vision and double vision.  Ears/ Nose/ Throat:   Patient denies sore throat and sinus problems.  Hematologic/Lymphatic:   Patient denies swollen glands and easy bruising.  Cardiovascular:   Patient denies leg swelling and chest pains.  Respiratory:   Patient reports cough. Patient denies shortness of breath.  Endocrine:   Patient denies excessive thirst.  Musculoskeletal:   Patient reports back pain. Patient denies joint pain.  Neurological:   Patient reports headaches and dizziness.   Psychologic:   Patient denies depression and anxiety.   Notes: weak stream, down to middle of back and left flank pain     VITAL SIGNS:      04/05/2019 10:40 AM  Weight 240 lb / 108.86 kg  Height 65 in / 165.1 cm  BP 107/70 mmHg  Pulse  65 /min  Temperature 97.5 F / 36.3 C  BMI 39.9 kg/m   MULTI-SYSTEM PHYSICAL EXAMINATION:    Constitutional: Well-nourished. No physical deformities. Normally developed. Good grooming.  Neck: Neck symmetrical, not swollen. Normal tracheal position.  Respiratory: No labored breathing, no use of accessory muscles.   Cardiovascular:  Normal temperature  Skin: No paleness, no jaundice, no cyanosis. No lesion, no ulcer, no rash.  Neurologic / Psychiatric: Oriented to time, oriented to place, oriented to person. No depression, no anxiety, no agitation.  Gastrointestinal: No mass, no tenderness, no rigidity, non obese abdomen.  Eyes: Normal conjunctivae. Normal eyelids.  Ears, Nose, Mouth, and Throat: Left ear no scars, no lesions, no masses. Right ear no scars, no lesions, no masses. Nose no scars, no lesions, no masses. Normal hearing. Normal lips.  Musculoskeletal: Normal gait and station of head and neck.     PAST DATA REVIEWED:  Source Of History:  Patient  Records Review:   Previous Patient Records  X-Ray Review: C.T. Abdomen/Pelvis: Reviewed Films. Discussed With Patient.     PROCEDURES:          Urinalysis w/Scope Dipstick Dipstick Cont'd Micro  Color: Amber Bilirubin: Neg mg/dL WBC/hpf: 10 - 20/hpf  Appearance: Cloudy Ketones: Neg mg/dL RBC/hpf: 20 - 40/hpf  Specific Gravity: 1.030 Blood: 3+ ery/uL Bacteria: Many (>50/hpf)  pH: 5.5 Protein: 2+ mg/dL Cystals: NS (Not Seen)  Glucose: Neg mg/dL Urobilinogen: 0.2 mg/dL Casts: NS (Not Seen)    Nitrites: Neg Trichomonas: Not Present    Leukocyte Esterase: Trace leu/uL Mucous: Not Present      Epithelial Cells: 0 - 5/hpf      Yeast: NS (Not Seen)      Sperm: Not Present    Notes: MICROSCOPIC NOT CONCENTRATED.    ASSESSMENT:      ICD-10 Details  1 GU:   Ureteral calculus - N20.1 Based on the size of kidney stone and patient's urinalysis and recommend cystoscopy with stent placement today. Her urinalysis concerning for infection and 1 cm stone will likely not pass on its own. I discussed the risks benefits of a cystoscopy with stent placement and patient has agreed to proceed. Patient understands this will be a staged procedure and she will need definitive stone management in approximately 2-3 weeks.   PLAN:           Document Letter(s):  Created for Patient:  Clinical Summary         Notes:   cc: Mercie Eon, NP    Signed by Jacalyn Lefevre, M.D. on 04/05/19 at 11:40 AM (EST)

## 2019-04-11 ENCOUNTER — Other Ambulatory Visit: Payer: Self-pay | Admitting: Urology

## 2019-04-17 NOTE — Patient Instructions (Addendum)
DUE TO COVID-19 ONLY ONE VISITOR IS ALLOWED TO COME WITH YOU AND STAY IN THE WAITING ROOM ONLY DURING PRE OP AND PROCEDURE DAY OF SURGERY. THE 1 VISITOR MAY VISIT WITH YOU AFTER SURGERY IN YOUR PRIVATE ROOM DURING VISITING HOURS ONLY!  YOU NEED TO HAVE A COVID 19 TEST ON___Friday 01/15/2021____ @___   210 pm____, THIS TEST MUST BE DONE BEFORE SURGERY, COME  [address], [place] , .  [facility]) ONCE YOUR COVID TEST IS COMPLETED, PLEASE BEGIN THE QUARANTINE INSTRUCTIONS AS OUTLINED IN YOUR HANDOUT.                Nancy Cordova     Your procedure is scheduled on: Tuesday 04/24/2019   Report to [facility] Main  Entrance    Report to Short Stay at 0530  AM     Call this number if you have problems the morning of surgery [phone]     Remember: Do not eat food or drink liquids :After Midnight.     BRUSH YOUR TEETH MORNING OF SURGERY AND RINSE YOUR MOUTH OUT, NO CHEWING GUM CANDY OR MINTS.     Take these medicines the morning of surgery with A SIP OF WATER: Citalopram (Celexa), Cetirizine (Zyrtec), Oxycodone-acetaminophen ( Percocet/Roxicet) if needed for pain, Oxybutynin (Ditropan) if needed for urinary spasms,  use Albuterol inhaler if needed and bring inhaler with you to the hospital                                 You may not have any metal on your body including hair pins and              piercings  Do not wear jewelry, make-up, lotions, powders or perfumes, deodorant             Do not wear nail polish on your fingernails.  Do not shave  48 hours prior to surgery.                 Do not bring valuables to the hospital. [facility] IS NOT             RESPONSIBLE   FOR VALUABLES.  Contacts, dentures or bridgework may not be worn into surgery.  Leave suitcase in the car. After surgery it may be brought to your room.     Patients discharged the day of surgery will not be allowed to drive home. IF YOU ARE HAVING SURGERY AND GOING HOME  THE SAME DAY, YOU MUST HAVE AN ADULT TO DRIVE YOU HOME AND  BE WITH YOU FOR 24 HOURS. YOU MAY GO HOME BY TAXI OR UBER OR ORTHERWISE, BUT AN ADULT MUST ACCOMPANY YOU HOME AND STAY WITH YOU FOR 24 HOURS.  Name and phone number of your driver:friend- 03-15-1998  [phone]                Please read over the following fact sheets you were given: _____________________________________________________________________             [facility] - Preparing for Surgery Before surgery, you can play an important role.  Because skin is not sterile, your skin needs to be as free of germs as possible.  You can reduce the number of germs on your skin by washing with CHG (chlorahexidine gluconate) soap before surgery.  CHG is an antiseptic cleaner which kills germs and bonds with the skin to  continue killing germs even after washing. Please DO NOT use if you have an allergy to CHG or antibacterial soaps.  If your skin becomes reddened/irritated stop using the CHG and inform your nurse when you arrive at Short Stay. Do not shave (including legs and underarms) for at least 48 hours prior to the first CHG shower.  You may shave your face/neck. Please follow these instructions carefully:  1.  Shower with CHG Soap the night before surgery and the  morning of Surgery.  2.  If you choose to wash your hair, wash your hair first as usual with your  normal  shampoo.  3.  After you shampoo, rinse your hair and body thoroughly to remove the  shampoo.                           4.  Use CHG as you would any other liquid soap.  You can apply chg directly  to the skin and wash                       Gently with a scrungie or clean washcloth.  5.  Apply the CHG Soap to your body ONLY FROM THE NECK DOWN.   Do not use on face/ open                           Wound or open sores. Avoid contact with eyes, ears mouth and genitals (private parts).                       Wash face,  Genitals (private parts) with your normal soap.              6.  Wash thoroughly, paying special attention to the area where your surgery  will be performed.  7.  Thoroughly rinse your body with warm water from the neck down.  8.  DO NOT shower/wash with your normal soap after using and rinsing off  the CHG Soap.                9.  Pat yourself dry with a clean towel.            10.  Wear clean pajamas.            11.  Place clean sheets on your bed the night of your first shower and do not  sleep with pets. Day of Surgery : Do not apply any lotions/deodorants the morning of surgery.  Please wear clean clothes to the hospital/surgery center.  FAILURE TO FOLLOW THESE INSTRUCTIONS MAY RESULT IN THE CANCELLATION OF YOUR SURGERY PATIENT SIGNATURE_________________________________  NURSE SIGNATURE__________________________________  __

## 2019-04-19 ENCOUNTER — Other Ambulatory Visit (HOSPITAL_COMMUNITY): Payer: Self-pay | Admitting: *Deleted

## 2019-04-20 ENCOUNTER — Encounter (HOSPITAL_COMMUNITY)
Admission: RE | Admit: 2019-04-20 | Discharge: 2019-04-20 | Disposition: A | Discharge status: Home / Self Care | Payer: BC Managed Care – PPO | Source: Ambulatory Visit | Attending: Urology | Admitting: Urology

## 2019-04-20 ENCOUNTER — Encounter (HOSPITAL_COMMUNITY): Payer: Self-pay

## 2019-04-20 ENCOUNTER — Other Ambulatory Visit: Payer: Self-pay | Admitting: Urology

## 2019-04-20 ENCOUNTER — Other Ambulatory Visit (HOSPITAL_COMMUNITY)
Admission: RE | Admit: 2019-04-20 | Discharge: 2019-04-20 | Disposition: A | Discharge status: Home / Self Care | Payer: BC Managed Care – PPO | Source: Ambulatory Visit | Attending: Urology | Admitting: Urology

## 2019-04-20 ENCOUNTER — Other Ambulatory Visit: Payer: Self-pay

## 2019-04-20 DIAGNOSIS — Z01812 Encounter for preprocedural laboratory examination: Secondary | ICD-10-CM | POA: Insufficient documentation

## 2019-04-20 DIAGNOSIS — Z20822 Contact with and (suspected) exposure to covid-19: Secondary | ICD-10-CM | POA: Diagnosis not present

## 2019-04-20 HISTORY — DX: Other complications of anesthesia, initial encounter: T88.59XA

## 2019-04-20 HISTORY — DX: Polycystic ovarian syndrome: E28.2

## 2019-04-20 LAB — BASIC METABOLIC PANEL
Anion gap: 9 (ref 5–15)
BUN: 11 mg/dL (ref 6–20)
CO2: 23 mmol/L (ref 22–32)
Calcium: 8.6 mg/dL — ABNORMAL LOW (ref 8.9–10.3)
Chloride: 108 mmol/L (ref 98–111)
Creatinine, Ser: 0.53 mg/dL (ref 0.44–1.00)
GFR calc Af Amer: >60 mL/min (ref >60)
GFR calc non Af Amer: >60 mL/min (ref >60)
Glucose, Bld: 103 mg/dL — ABNORMAL HIGH (ref 70–99)
Potassium: 3.7 mmol/L (ref 3.5–5.1)
Sodium: 140 mmol/L (ref 135–145)

## 2019-04-20 LAB — CBC
HCT: 38.2 % (ref 36.0–46.0)
Hemoglobin: 12.6 g/dL (ref 12.0–15.0)
MCH: 30.9 pg (ref 26.0–34.0)
MCHC: 33 g/dL (ref 30.0–36.0)
MCV: 93.6 fL (ref 80.0–100.0)
Platelets: 301 10*3/uL (ref 150–400)
RBC: 4.08 MIL/uL (ref 3.87–5.11)
RDW: 12.7 % (ref 11.5–15.5)
WBC: 7.3 10*3/uL (ref 4.0–10.5)
nRBC: 0 % (ref 0.0–0.2)

## 2019-04-20 NOTE — Progress Notes (Addendum)
PCP - Burnett Kanaris, PA Cardiologist - n/a  Chest x-ray - n/a 04/04/2019- CT abd.pelvis w/contrast  EPIC EKG - n/a Stress Test - n/a ECHO - n/a Cardiac Cath -n/a   Sleep Study - n/a CPAP n/a-   Fasting Blood Sugar -n/a  Checks Blood Sugar __0___ times a day  Blood Thinner Instructions:n/a Aspirin Instructions:n/a Last Dose:n/a  Anesthesia review:   Patient has a history of fibromyalgia, kidney stones, anxiety and depression.  Patient denies shortness of breath, fever, cough and chest pain at PAT appointment   Patient verbalized understanding of instructions that were given to them at the PAT appointment. Patient was also instructed that they will need to review over the PAT instructions again at home before surgery.

## 2019-04-21 LAB — NOVEL CORONAVIRUS, NAA (HOSP ORDER, SEND-OUT TO REF LAB; TAT 18-24 HRS): SARS-CoV-2, NAA: NOT DETECTED

## 2019-04-23 NOTE — Anesthesia Preprocedure Evaluation (Addendum)
Anesthesia Evaluation  Patient identified by MRN, date of birth, ID band Patient awake    Reviewed: Allergy & Precautions, NPO status , Patient's Chart, lab work & pertinent test results  Airway Mallampati: II       Dental no notable dental hx. (+) Dental Advisory Given, Teeth Intact   Pulmonary    Pulmonary exam normal breath sounds clear to auscultation       Cardiovascular negative cardio ROS Normal cardiovascular exam Rhythm:Regular Rate:Normal     Neuro/Psych PSYCHIATRIC DISORDERS Anxiety Depression    GI/Hepatic negative GI ROS, Neg liver ROS,   Endo/Other  Morbid obesity  Renal/GU negative Renal ROS     Musculoskeletal   Abdominal (+) + obese,   Peds  Hematology negative hematology ROS (+)   Anesthesia Other Findings   Reproductive/Obstetrics                            Anesthesia Physical  Anesthesia Plan  ASA: III  Anesthesia Plan: General   Post-op Pain Management:    Induction: Intravenous  PONV Risk Score and Plan: 3 and Ondansetron, Midazolam and Scopolamine patch - Pre-op  Airway Management Planned: LMA  Additional Equipment: None  Intra-op Plan:   Post-operative Plan: Extubation in OR  Informed Consent: I have reviewed the patients History and Physical, chart, labs and discussed the procedure including the risks, benefits and alternatives for the proposed anesthesia with the patient or authorized representative who has indicated his/her understanding and acceptance.     Dental advisory given  Plan Discussed with: CRNA  Anesthesia Plan Comments:        Anesthesia Quick Evaluation

## 2019-04-24 ENCOUNTER — Encounter (HOSPITAL_COMMUNITY)
Admission: RE | Disposition: A | Discharge status: Home / Self Care | Payer: Self-pay | Source: Home / Self Care | Attending: Urology

## 2019-04-24 ENCOUNTER — Ambulatory Visit (HOSPITAL_COMMUNITY)
Admission: RE | Admit: 2019-04-24 | Discharge: 2019-04-24 | Disposition: A | Discharge status: Home / Self Care | Payer: BC Managed Care – PPO | Attending: Urology | Admitting: Urology

## 2019-04-24 ENCOUNTER — Encounter (HOSPITAL_COMMUNITY): Payer: Self-pay | Admitting: Urology

## 2019-04-24 ENCOUNTER — Other Ambulatory Visit: Payer: Self-pay

## 2019-04-24 ENCOUNTER — Ambulatory Visit (HOSPITAL_COMMUNITY): Payer: BC Managed Care – PPO | Admitting: Physician Assistant

## 2019-04-24 ENCOUNTER — Ambulatory Visit (HOSPITAL_COMMUNITY): Payer: BC Managed Care – PPO

## 2019-04-24 DIAGNOSIS — Z9884 Bariatric surgery status: Secondary | ICD-10-CM | POA: Diagnosis not present

## 2019-04-24 DIAGNOSIS — F419 Anxiety disorder, unspecified: Secondary | ICD-10-CM | POA: Diagnosis not present

## 2019-04-24 DIAGNOSIS — Z882 Allergy status to sulfonamides status: Secondary | ICD-10-CM | POA: Insufficient documentation

## 2019-04-24 DIAGNOSIS — N202 Calculus of kidney with calculus of ureter: Secondary | ICD-10-CM | POA: Insufficient documentation

## 2019-04-24 DIAGNOSIS — Z6841 Body Mass Index (BMI) 40.0 and over, adult: Secondary | ICD-10-CM | POA: Insufficient documentation

## 2019-04-24 DIAGNOSIS — Z881 Allergy status to other antibiotic agents status: Secondary | ICD-10-CM | POA: Insufficient documentation

## 2019-04-24 DIAGNOSIS — F329 Major depressive disorder, single episode, unspecified: Secondary | ICD-10-CM | POA: Insufficient documentation

## 2019-04-24 DIAGNOSIS — Z880 Allergy status to penicillin: Secondary | ICD-10-CM | POA: Diagnosis not present

## 2019-04-24 HISTORY — PX: CYSTOSCOPY WITH RETROGRADE PYELOGRAM, URETEROSCOPY AND STENT PLACEMENT: SHX5789

## 2019-04-24 HISTORY — PX: HOLMIUM LASER APPLICATION: SHX5852

## 2019-04-24 LAB — PREGNANCY, URINE: Preg Test, Ur: NEGATIVE

## 2019-04-24 SURGERY — CYSTOURETEROSCOPY, WITH RETROGRADE PYELOGRAM AND STENT INSERTION
Anesthesia: General | Laterality: Left

## 2019-04-24 MED ORDER — EPHEDRINE 5 MG/ML INJ
INTRAVENOUS | Status: AC
  Filled 2019-04-24: qty 10

## 2019-04-24 MED ORDER — ACETAMINOPHEN 325 MG PO TABS: 325.0000 mg | ORAL_TABLET | ORAL | Status: DC | PRN

## 2019-04-24 MED ORDER — FENTANYL CITRATE (PF) 100 MCG/2ML IJ SOLN: 25.0000 ug | INTRAMUSCULAR | Status: DC | PRN

## 2019-04-24 MED ORDER — KETOROLAC TROMETHAMINE 30 MG/ML IJ SOLN: 30.0000 mg | Freq: Once | INTRAMUSCULAR | Status: DC | PRN

## 2019-04-24 MED ORDER — DEXAMETHASONE SODIUM PHOSPHATE 10 MG/ML IJ SOLN
INTRAMUSCULAR | Status: AC
  Filled 2019-04-24: qty 1

## 2019-04-24 MED ORDER — SODIUM CHLORIDE 0.9 % IR SOLN
Status: DC | PRN
  Administered 2019-04-24: 3000 mL

## 2019-04-24 MED ORDER — LACTATED RINGERS IV SOLN: INTRAVENOUS | Status: DC

## 2019-04-24 MED ORDER — ACETAMINOPHEN 160 MG/5ML PO SOLN: 325.0000 mg | ORAL | Status: DC | PRN

## 2019-04-24 MED ORDER — MIDAZOLAM HCL 5 MG/5ML IJ SOLN
INTRAMUSCULAR | Status: DC | PRN
  Administered 2019-04-24: 2 mg via INTRAVENOUS

## 2019-04-24 MED ORDER — CIPROFLOXACIN HCL 500 MG PO TABS: 500.0000 mg | ORAL_TABLET | Freq: Every day | ORAL | 0 refills | Status: AC

## 2019-04-24 MED ORDER — EPHEDRINE SULFATE-NACL 50-0.9 MG/10ML-% IV SOSY
PREFILLED_SYRINGE | INTRAVENOUS | Status: DC | PRN
  Administered 2019-04-24 (×2): 5 mg via INTRAVENOUS

## 2019-04-24 MED ORDER — FENTANYL CITRATE (PF) 100 MCG/2ML IJ SOLN
INTRAMUSCULAR | Status: DC | PRN
  Administered 2019-04-24: 25 ug via INTRAVENOUS
  Administered 2019-04-24: 50 ug via INTRAVENOUS
  Administered 2019-04-24: 25 ug via INTRAVENOUS

## 2019-04-24 MED ORDER — FENTANYL CITRATE (PF) 100 MCG/2ML IJ SOLN
INTRAMUSCULAR | Status: AC
  Filled 2019-04-24: qty 2

## 2019-04-24 MED ORDER — OXYCODONE-ACETAMINOPHEN 5-325 MG PO TABS: 1.0000 | ORAL_TABLET | ORAL | 0 refills | Status: AC | PRN

## 2019-04-24 MED ORDER — SCOPOLAMINE 1 MG/3DAYS TD PT72
MEDICATED_PATCH | TRANSDERMAL | Status: DC | PRN
  Administered 2019-04-24: 1 via TRANSDERMAL

## 2019-04-24 MED ORDER — 0.9 % SODIUM CHLORIDE (POUR BTL) OPTIME
TOPICAL | Status: DC | PRN
  Administered 2019-04-24: 08:00:00 1000 mL

## 2019-04-24 MED ORDER — PROPOFOL 10 MG/ML IV BOLUS
INTRAVENOUS | Status: AC
  Filled 2019-04-24: qty 20

## 2019-04-24 MED ORDER — ONDANSETRON HCL 4 MG/2ML IJ SOLN
INTRAMUSCULAR | Status: DC | PRN
  Administered 2019-04-24: 4 mg via INTRAVENOUS

## 2019-04-24 MED ORDER — LIDOCAINE 2% (20 MG/ML) 5 ML SYRINGE
INTRAMUSCULAR | Status: DC | PRN
  Administered 2019-04-24: 80 mg via INTRAVENOUS

## 2019-04-24 MED ORDER — MEPERIDINE HCL 50 MG/ML IJ SOLN: 6.2500 mg | INTRAMUSCULAR | Status: DC | PRN

## 2019-04-24 MED ORDER — PROPOFOL 10 MG/ML IV BOLUS
INTRAVENOUS | Status: DC | PRN
  Administered 2019-04-24: 200 mg via INTRAVENOUS

## 2019-04-24 MED ORDER — CIPROFLOXACIN IN D5W 400 MG/200ML IV SOLN
400.0000 mg | Freq: Two times a day (BID) | INTRAVENOUS | Status: DC
  Administered 2019-04-24: 400 mg via INTRAVENOUS
  Filled 2019-04-24: qty 200

## 2019-04-24 MED ORDER — LIDOCAINE 2% (20 MG/ML) 5 ML SYRINGE
INTRAMUSCULAR | Status: AC
  Filled 2019-04-24: qty 5

## 2019-04-24 MED ORDER — OXYCODONE HCL 5 MG PO TABS: 5.0000 mg | ORAL_TABLET | Freq: Once | ORAL | Status: DC | PRN

## 2019-04-24 MED ORDER — DEXAMETHASONE SODIUM PHOSPHATE 10 MG/ML IJ SOLN
INTRAMUSCULAR | Status: DC | PRN
  Administered 2019-04-24: 8 mg via INTRAVENOUS

## 2019-04-24 MED ORDER — ONDANSETRON HCL 4 MG/2ML IJ SOLN
INTRAMUSCULAR | Status: AC
  Filled 2019-04-24: qty 2

## 2019-04-24 MED ORDER — ONDANSETRON HCL 4 MG/2ML IJ SOLN: 4.0000 mg | Freq: Once | INTRAMUSCULAR | Status: DC | PRN

## 2019-04-24 MED ORDER — MIDAZOLAM HCL 2 MG/2ML IJ SOLN
INTRAMUSCULAR | Status: AC
  Filled 2019-04-24: qty 2

## 2019-04-24 MED ORDER — SCOPOLAMINE 1 MG/3DAYS TD PT72
MEDICATED_PATCH | TRANSDERMAL | Status: AC
  Filled 2019-04-24: qty 1

## 2019-04-24 MED ORDER — OXYCODONE HCL 5 MG/5ML PO SOLN: 5.0000 mg | Freq: Once | ORAL | Status: DC | PRN

## 2019-04-24 SURGICAL SUPPLY — 24 items
BAG URO CATCHER STRL LF (MISCELLANEOUS) ×3 IMPLANT
BASKET ZERO TIP NITINOL 2.4FR (BASKET) IMPLANT
CATH URET 5FR 28IN OPEN ENDED (CATHETERS) ×3 IMPLANT
CLOTH BEACON ORANGE TIMEOUT ST (SAFETY) ×3 IMPLANT
COVER WAND RF STERILE (DRAPES) IMPLANT
DRSG TEGADERM 2-3/8X2-3/4 SM (GAUZE/BANDAGES/DRESSINGS) ×5 IMPLANT
EXTRACTOR STONE 1.7FRX115CM (UROLOGICAL SUPPLIES) IMPLANT
GLOVE BIO SURGEON STRL SZ 6.5 (GLOVE) ×2 IMPLANT
GLOVE BIO SURGEONS STRL SZ 6.5 (GLOVE) ×1
GOWN STRL REUS W/TWL LRG LVL3 (GOWN DISPOSABLE) ×3 IMPLANT
GUIDEWIRE ANG ZIPWIRE 038X150 (WIRE) IMPLANT
GUIDEWIRE STR DUAL SENSOR (WIRE) ×3 IMPLANT
GUIDEWIRE ZIPWRE .038 STRAIGHT (WIRE) ×2 IMPLANT
KIT TURNOVER KIT A (KITS) IMPLANT
MANIFOLD NEPTUNE II (INSTRUMENTS) ×3 IMPLANT
PACK CYSTO (CUSTOM PROCEDURE TRAY) ×3 IMPLANT
PENCIL SMOKE EVACUATOR (MISCELLANEOUS) IMPLANT
SHEATH URETERAL 12FRX28CM (UROLOGICAL SUPPLIES) ×2 IMPLANT
SHEATH URETERAL 12FRX35CM (MISCELLANEOUS) IMPLANT
STENT CONTOUR 6FRX26X.038 (STENTS) ×2 IMPLANT
TUBING CONNECTING 10 (TUBING) ×2 IMPLANT
TUBING CONNECTING 10' (TUBING) ×1
TUBING UROLOGY SET (TUBING) ×3 IMPLANT
WIRE COONS/BENSON .038X145CM (WIRE) IMPLANT

## 2019-04-24 NOTE — Interval H&P Note (Signed)
History and Physical Interval Note: Patient underwent cystoscopy with left ureteral stent placement on 04/04/20.  She returns today for ureterscopy, laser lithotripsy and stent placement.  She has been doing well and is without complaint.  She did have blood in her urine this AM.   04/24/2019 7:07 AM  Nancy Cordova  has presented today for surgery, with the diagnosis of LEFT URETERAL CALCULUS.  The various methods of treatment have been discussed with the patient and family. After consideration of risks, benefits and other options for treatment, the patient has consented to  Procedure(s) with comments: CYSTOSCOPY WITH RETROGRADE PYELOGRAM, URETEROSCOPY AND STENT PLACEMENT (Left) - 75 MINS HOLMIUM LASER APPLICATION (Left) as a surgical intervention.  The patient's history has been reviewed, patient examined, no change in status, stable for surgery.  I have reviewed the patient's chart and labs.  Questions were answered to the patient's satisfaction.     Jezreel Justiniano D Cerria Randhawa

## 2019-04-24 NOTE — Anesthesia Procedure Notes (Signed)
Procedure Name: LMA Insertion Date/Time: 04/24/2019 7:34 AM Performed by: Elisabeth Cara, CRNA Pre-anesthesia Checklist: Patient identified, Emergency Drugs available, Suction available, Patient being monitored and Timeout performed Patient Re-evaluated:Patient Re-evaluated prior to induction Oxygen Delivery Method: Circle system utilized Preoxygenation: Pre-oxygenation with 100% oxygen Induction Type: IV induction LMA: LMA with gastric port inserted LMA Size: 4.0 Number of attempts: 1 Placement Confirmation: positive ETCO2 and breath sounds checked- equal and bilateral Tube secured with: Tape Dental Injury: Teeth and Oropharynx as per pre-operative assessment

## 2019-04-24 NOTE — Discharge Instructions (Signed)
Post stent placement instructions   Definitions:  Ureter: The duct that transports urine from the kidney to the bladder. Stent: A plastic hollow tube that is placed into the ureter, from the kidney to the bladder to prevent the ureter from swelling shut.  General instructions:  Despite the fact that no skin incisions were used, the area around the ureter and bladder is raw and irritated. The stent is a foreign body which can further irritate the bladder wall. This irritation is manifested by increased frequency of urination, both day and night, and by an increase in the urge to urinate. In some, the urge to urinate is present almost always. Sometimes the urge is strong enough that you may not be able to stop your self from urinating. This can often be controlled with medication but does not occur in everyone. A stent can safely be left in place for 3 months or greater.  You may see some blood in your urine while the stent is in place and a few days afterward. Do not be alarmed, even if the urine is clear for a while. Get off your feet and drink lots of fluids until clearing occurs. If you start to pass clots or don't improve, call us.  Diet:  You may return to your normal diet immediately. Because of the raw surface of your bladder, alcohol, spicy foods, foods high in acid and drinks with caffeine may cause irritation or frequency and should be used in moderation. To keep your urine flowing freely and avoid constipation, drink plenty of fluids during the day (8-10 glasses). Tip: Avoid cranberry juice because it is very acidic.  Activity:  Your physical activity doesn't need to be restricted. However, if you are very active, you may see some blood in the urine. We suggest that you reduce your activity under the circumstances until the bleeding has stopped.  Bowels:  It is important to keep your bowels regular during the postoperative period. Straining with bowel movements can cause bleeding. A  bowel movement every other day is reasonable. Use a mild laxative if needed, such as milk of magnesia 2-3 tablespoons, or 2 Dulcolax tablets. Call if you continue to have problems. If you had been taking narcotics for pain, before, during or after your surgery, you may be constipated. Take a laxative if necessary.  Medication:  You should resume your pre-surgery medications unless told not to. In addition you may be given an antibiotic to prevent or treat infection. Antibiotics are not always necessary. All medication should be taken as prescribed until the bottles are finished unless you are having an unusual reaction to one of the drugs.  Problems you should report to us:  a. Fever greater than 101F. b. Heavy bleeding, or clots (see notes above about blood in urine). c. Inability to urinate. d. Drug reactions (hives, rash, nausea, vomiting, diarrhea). e. Severe burning or pain with urination that is not improving.  Stent removal Please take 1 tab of Cipro the morning of stent removal.  You can remove the stent on Friday, 04/27/19. Remove the bandage taped to your leg and pull the string until the stent is removed.  If you experience pain after the stent is removed, please take motrin/ibuprofen and flomax to help with discomfort.  If you have difficulty removing the stent you can call the office.     

## 2019-04-24 NOTE — Op Note (Signed)
PATIENT:  Nancy Cordova  PRE-OPERATIVE DIAGNOSIS:  left Ureteral calculus  POST-OPERATIVE DIAGNOSIS: Same  PROCEDURE:  1. Cystoscopy, left flexible ureteroscopy with laser lithotripsy, and stent exchange  SURGEON: Kasandra Knudsen, MD  INDICATION: 49 year old woman with 9 mm left proximal ureteral calculus status post ureteral stent placement on 04/05/2019 returns for definitive stone management.  ANESTHESIA:  General  EBL:  Minimal  DRAINS: 6Fr x 26 cm left JJ ureteral stent  SPECIMEN:  none  DESCRIPTION OF PROCEDURE: The patient was taken to the major OR and placed on the table. General anesthesia was administered and then the patient was moved to the dorsal lithotomy position. The genitalia was sterilely prepped and draped. An official timeout was performed.  Initially the 23 French cystoscope with 30 lens was passed under direct vision into the bladder. The bladder was then fully inspected. It was noted be free of any tumors, stones or inflammatory lesions. Ureteral orifices were of normal configuration and position.  The existing left ureteral stent was grasped with a grasper and brought to the urethral meatus.  A 0.38 sensor wire was then advanced through the ureteral stent up to the kidney with fluoroscopic guidance.  The ureteral stent was then discarded.  Next a second wire was placed alongside the sensor wire and advanced to the kidney.  A ureteral access sheath was then placed over the second wire with fluoroscopic guidance.  The inner sheath and wire were removed.  A 6 French possible ureteroscope was then passed under direct into that sheath and up the ureter. The stone was identified and 200  holmium laser fiber was used to fragment the stone. I then used the nitinol basket to extract all of the stone fragments and reinspection of the ureter ureteroscopically revealed no further stone fragments and no injury to the ureter. I then backloaded the cystoscope over the  guidewire and passed the stent over the guidewire into the area of the renal pelvis. As the guidewire was removed good curl was noted in the renal pelvis. The bladder was drained and the cystoscope was then removed. The patient tolerated the procedure well no intraoperative complications.  PLAN OF CARE: Discharge to home after PACU  PATIENT DISPOSITION:  PACU - hemodynamically stable.  FOLLOW UP: stent with string left and postop instructions to remove stent in 3 days

## 2019-04-24 NOTE — Anesthesia Postprocedure Evaluation (Signed)
Anesthesia Post Note  Patient: Nancy Cordova  Procedure(s) Performed: CYSTOSCOPY WITH URETEROSCOPY AND STENT EXCHANGE (Left ) HOLMIUM LASER APPLICATION (Left )     Patient location during evaluation: PACU Anesthesia Type: General Level of consciousness: sedated and patient cooperative Pain management: pain level controlled Vital Signs Assessment: post-procedure vital signs reviewed and stable Respiratory status: spontaneous breathing Cardiovascular status: stable Anesthetic complications: no    Last Vitals:  Vitals:   04/24/19 0900 04/24/19 1003  BP:  (!) 151/92  Pulse:  78  Resp:  18  Temp: 36.9 C 36.7 C  SpO2:  98%    Last Pain:  Vitals:   04/24/19 1003  TempSrc: Oral  PainSc: 0-No pain                 Lewie Loron

## 2019-04-24 NOTE — Transfer of Care (Signed)
Immediate Anesthesia Transfer of Care Note  Patient: Nancy Cordova  Procedure(s) Performed: CYSTOSCOPY WITH URETEROSCOPY AND STENT EXCHANGE (Left ) HOLMIUM LASER APPLICATION (Left )  Patient Location: PACU  Anesthesia Type:General  Level of Consciousness: awake, alert , oriented and patient cooperative  Airway & Oxygen Therapy: Patient Spontanous Breathing and Patient connected to face mask oxygen  Post-op Assessment: Report given to RN, Post -op Vital signs reviewed and stable and Patient moving all extremities  Post vital signs: Reviewed and stable  Last Vitals:  Vitals Value Taken Time  BP    Temp    Pulse 78 04/24/19 0841  Resp 15 04/24/19 0841  SpO2 100 % 04/24/19 0841  Vitals shown include unvalidated device data.  Last Pain:  Vitals:   04/24/19 0622  TempSrc:   PainSc: 0-No pain         Complications: No apparent anesthesia complications

## 2019-04-25 NOTE — Progress Notes (Signed)
Left message

## 2019-05-30 ENCOUNTER — Telehealth: Payer: BC Managed Care – PPO

## 2021-02-11 ENCOUNTER — Ambulatory Visit: Payer: BC Managed Care – PPO | Admitting: Behavioral Health

## 2021-02-19 ENCOUNTER — Ambulatory Visit: Payer: BC Managed Care – PPO | Admitting: Psychiatry

## 2021-02-23 ENCOUNTER — Encounter: Payer: Self-pay | Admitting: Behavioral Health

## 2021-02-23 ENCOUNTER — Ambulatory Visit: Payer: BC Managed Care – PPO | Admitting: Behavioral Health

## 2021-02-23 ENCOUNTER — Other Ambulatory Visit: Payer: Self-pay

## 2021-02-23 DIAGNOSIS — F4381 Prolonged grief disorder: Secondary | ICD-10-CM | POA: Diagnosis not present

## 2021-02-23 DIAGNOSIS — F411 Generalized anxiety disorder: Secondary | ICD-10-CM | POA: Diagnosis not present

## 2021-02-23 DIAGNOSIS — F331 Major depressive disorder, recurrent, moderate: Secondary | ICD-10-CM

## 2021-02-23 MED ORDER — CITALOPRAM HYDROBROMIDE 40 MG PO TABS
40.0000 mg | ORAL_TABLET | Freq: Every day | ORAL | 1 refills | Status: AC
Start: 2021-02-23 — End: ?

## 2021-02-23 NOTE — Progress Notes (Signed)
[facility] MD/PA/NP Initial Note  03/06/2021 1:24 AM Nancy Cordova  MRN:  [id number]  Chief Complaint:  Chief Complaint   Anxiety; Depression; Establish Care; Medication Refill; grief     HPI:   50 year old presents for initial visit and to establish care. She was previously receiving psychiatric care from her PCP [place]. She says that she is here today today to find new provider to manage her medications. She says that right now she is stable on Celexa 40  mg and does not want to make any changes at this time. She says that she is currently in 9 year relationship but not married . She was married previously but experienced miscarriage and does not have any children of her own. Says that she is still in grief over loss of children. She says that she also had a lot of stress assisting with sick mother. She is feeling like her anxiety and depression levels are manageable today. Says her anxiety is 3/10 and depression is 2/10. She is sleeping 7-8 hours per night. No mania, no psychosis, no SI/HI.  No prior medication failures noted  Visit Diagnosis:    ICD-10-CM   1. Generalized anxiety disorder  F41.1 citalopram (CELEXA) 40 MG tablet    2. Major depressive disorder, recurrent episode, moderate (HCC)  F33.1 citalopram (CELEXA) 40 MG tablet    3. 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.81 citalopram (CELEXA) 40 MG tablet      Past Psychiatric History: anxiety, depression, adjustment disorder  Past Medical History:  Past Medical History:  Diagnosis Date   Anxiety    Complication of anesthesia    during epidural -had heart rate drop and was given EPI pen   Depression    Fibromyalgia    Headache    History of kidney stones    PCOS (polycystic ovarian syndrome)    Pneumonia    fall 2019    Past Surgical History:  Procedure Laterality Date   CYSTOSCOPY W/ URETERAL STENT PLACEMENT Left 04/05/2019   Procedure: CYSTOSCOPY RETROGRADE WITH LEFT STENT  REPLACEMENT;  Surgeon: Robley Fries, MD;  Location: [facility];  Service: Urology;  Laterality: Left;   CYSTOSCOPY WITH RETROGRADE PYELOGRAM, URETEROSCOPY AND STENT PLACEMENT Left 04/24/2019   Procedure: CYSTOSCOPY WITH URETEROSCOPY AND STENT EXCHANGE;  Surgeon: Robley Fries, MD;  Location: [facility];  Service: Urology;  Laterality: Left;  75 MINS   DILATION AND CURETTAGE OF UTERUS     GASTRIC BYPASS     HOLMIUM LASER APPLICATION Left [id number]   Procedure: HOLMIUM LASER APPLICATION;  Surgeon: Robley Fries, MD;  Location: [facility];  Service: Urology;  Laterality: Left;   ROUX-EN-Y GASTRIC BYPASS  2011    Family Psychiatric History: see chart  Family History:  Family History  Problem Relation Age of Onset   Depression Mother    Anxiety disorder Mother    Drug abuse Maternal Uncle    Alcohol abuse Paternal Uncle    Alcohol abuse Maternal Grandfather    Bipolar disorder Maternal Grandmother    Alcohol abuse Paternal Grandfather    Drug abuse Cousin    Anxiety disorder Cousin     Social History:  Social History   Socioeconomic History   Marital status: Divorced    Spouse name: Not on file   Number of children: Not on file   Years of education: 18   Highest education level: Master's degree (e.g., MA, MS, MEng, MEd, MSW, MBA)  Occupational  History   Not on file  Tobacco Use   Smoking status: Never   Smokeless tobacco: Never   Tobacco comments:    exposed to second hand smoke  Vaping Use   Vaping Use: Never used  Substance and Sexual Activity   Alcohol use: Yes    Comment: occassional   Drug use: Never   Sexual activity: Yes    Birth control/protection: I.U.D.  Other Topics Concern   Not on file  Social History Narrative   Lives in [place] with significant other. Been together 9 years. Pottery painting, crafts in spare time.    Social Determinants of Health   Financial Resource Strain: Not on file  Food Insecurity: Not on file  Transportation Needs: Not on  file  Physical Activity: Not on file  Stress: Not on file  Social Connections: Not on file    Allergies:  Allergies  Allergen Reactions   Penicillins Nausea And Vomiting    Did it involve swelling of the face/tongue/throat, SOB, or low BP? No Did it involve sudden or severe rash/hives, skin peeling, or any reaction on the inside of your mouth or nose? No Did you need to seek medical attention at a hospital or doctor's office? No When did it last happen?   young adult    If all above answers are "NO", may proceed with cephalosporin use.    Sulfa Antibiotics Nausea Only    Metabolic Disorder Labs: No results found for: HGBA1C, MPG No results found for: PROLACTIN No results found for: CHOL, TRIG, HDL, CHOLHDL, VLDL, LDLCALC No results found for: TSH  Therapeutic Level Labs: No results found for: LITHIUM No results found for: VALPROATE No components found for:  CBMZ  Current Medications: Current Outpatient Medications  Medication Sig Dispense Refill   albuterol (VENTOLIN HFA) 108 (90 Base) MCG/ACT inhaler Inhale 1 puff into the lungs every 4 (four) hours as needed for shortness of breath.     B Complex Vitamins (B COMPLEX 1 PO) Take 1 capsule by mouth daily.     cetirizine (ZYRTEC) 10 MG tablet Take 10 mg by mouth daily.     Cholecalciferol (VITAMIN D) 125 MCG (5000 UT) CAPS Take 5,000 Units by mouth daily.      citalopram (CELEXA) 20 MG tablet Take 20 mg by mouth daily.     citalopram (CELEXA) 40 MG tablet Take 1 tablet (40 mg total) by mouth daily. 30 tablet 1   cyclobenzaprine (FLEXERIL) 10 MG tablet Take 1 tablet (10 mg total) by mouth nightly as needed.     MAGNESIUM PO Take 1 tablet by mouth 2 (two) times a week.      Multiple Vitamins-Minerals (MULTIVITAMIN ADULT PO) Take 1 tablet by mouth daily.     clonazePAM (KLONOPIN) 0.5 MG tablet Take 0.5 mg by mouth 2 (two) times daily as needed.     ondansetron (ZOFRAN ODT) 4 MG disintegrating tablet Take 1 tablet (4 mg total) by  mouth every 8 (eight) hours as needed for nausea or vomiting. 20 tablet 0   oxybutynin (DITROPAN) 5 MG tablet Take 1 tablet (5 mg total) by mouth every 8 (eight) hours as needed for bladder spasms. (Patient not taking: Reported on 02/23/2021) 60 tablet 0   SUMAtriptan (IMITREX) 100 MG tablet Take by mouth.     Vitamin D, Ergocalciferol, (DRISDOL) 1.25 MG (50000 UNIT) CAPS capsule Take by mouth.     WEGOVY 2.4 MG/0.75ML SOAJ Inject into the skin.     No current facility-administered  medications for this visit.    Medication Side Effects: none  Orders placed this visit:  No orders of the defined types were placed in this encounter.   Psychiatric Specialty Exam:  Review of Systems  There were no vitals taken for this visit.There is no height or weight on file to calculate BMI.  General Appearance: Casual, Neat, and Well Groomed  Eye Contact:  Good  Speech:  Clear and Coherent  Volume:  Normal  Mood:  NA  Affect:  Appropriate  Thought Process:  Coherent  Orientation:  Full (Time, Place, and Person)  Thought Content: Logical   Suicidal Thoughts:  No  Homicidal Thoughts:  No  Memory:  WNL  Judgement:  Good  Insight:  Good  Psychomotor Activity:  Normal  Concentration:  Concentration: Good  Recall:  Good  Fund of Knowledge: Good  Language: Good  Assets:  Desire for Improvement  ADL's:  Intact  Cognition: WNL  Prognosis:  Good   Screenings: Screening conducted on 02/23/2021 PHQ2-9    [place] Office Visit from 02/23/2021 in [facility]  PHQ-2 Total Score 1       Receiving Psychotherapy: No   Treatment Plan/Recommendations:  Greater than 50% of 45 min  face to face time with patient was spent on counseling and coordination of care. We discussed her hx of anxiety and depression as well as adjustment disorder and extended grief.  She agreed to: Continue Celexa 40 mg daily To report worsening symptoms promptly To follow up in 4 weeks to  reassess Provided emergency contact information Reviewed PDMP.    Nancy Brooklyn, NP

## 2021-03-06 ENCOUNTER — Encounter: Payer: Self-pay | Admitting: Behavioral Health

## 2021-03-17 ENCOUNTER — Ambulatory Visit: Payer: BC Managed Care – PPO | Admitting: Psychiatry

## 2021-03-23 ENCOUNTER — Ambulatory Visit: Payer: BC Managed Care – PPO | Admitting: Behavioral Health

## 2021-04-03 ENCOUNTER — Ambulatory Visit: Payer: BC Managed Care – PPO | Admitting: Behavioral Health

## 2021-04-14 ENCOUNTER — Ambulatory Visit: Payer: BC Managed Care – PPO | Admitting: Behavioral Health

## 2021-04-14 ENCOUNTER — Encounter: Payer: Self-pay | Admitting: Behavioral Health

## 2021-04-14 ENCOUNTER — Other Ambulatory Visit: Payer: Self-pay

## 2021-04-14 DIAGNOSIS — F411 Generalized anxiety disorder: Secondary | ICD-10-CM | POA: Diagnosis not present

## 2021-04-14 DIAGNOSIS — F331 Major depressive disorder, recurrent, moderate: Secondary | ICD-10-CM | POA: Diagnosis not present

## 2021-04-14 DIAGNOSIS — F4381 Prolonged grief disorder: Secondary | ICD-10-CM | POA: Diagnosis not present

## 2021-04-14 NOTE — Progress Notes (Signed)
[facility] Med Check  Patient ID: Nancy Cordova,  MRN: [id number]  PCP: Elisabeth Cara, PA-C  Date of Evaluation: 04/14/2021 Time spent:20 minutes  Chief Complaint:  Chief Complaint   Anxiety; Depression; Follow-up; Medication Refill     HISTORY/CURRENT STATUS: HPI  51 year old presents for follow up and medication management. . She says that right now she is stable on Celexa 40  mg and does not want to make any changes at this time. She says that she also had a lot of stress assisting with sick mother.  She is a little concerned about feeling more "num" to things but still wants to continue with current dosing. She is feeling like her anxiety and depression levels are manageable today. Says her anxiety is 3/10 and depression is 2/10. She is sleeping 7-8 hours per night. No mania, no psychosis, no SI/HI.   No prior medication failures noted   Individual Medical History/ Review of Systems: Changes? :No   Allergies: Penicillins and Sulfa antibiotics  Current Medications:  Current Outpatient Medications:    albuterol (VENTOLIN HFA) 108 (90 Base) MCG/ACT inhaler, Inhale 1 puff into the lungs every 4 (four) hours as needed for shortness of breath., Disp: , Rfl:    B Complex Vitamins (B COMPLEX 1 PO), Take 1 capsule by mouth daily., Disp: , Rfl:    cetirizine (ZYRTEC) 10 MG tablet, Take 10 mg by mouth daily., Disp: , Rfl:    Cholecalciferol (VITAMIN D) 125 MCG (5000 UT) CAPS, Take 5,000 Units by mouth daily. , Disp: , Rfl:    citalopram (CELEXA) 20 MG tablet, Take 20 mg by mouth daily., Disp: , Rfl:    citalopram (CELEXA) 40 MG tablet, Take 1 tablet (40 mg total) by mouth daily., Disp: 30 tablet, Rfl: 1   clonazePAM (KLONOPIN) 0.5 MG tablet, Take 0.5 mg by mouth 2 (two) times daily as needed., Disp: , Rfl:    cyclobenzaprine (FLEXERIL) 10 MG tablet, Take 1 tablet (10 mg total) by mouth nightly as needed., Disp: , Rfl:    MAGNESIUM PO, Take 1 tablet by mouth 2 (two)  times a week. , Disp: , Rfl:    Multiple Vitamins-Minerals (MULTIVITAMIN ADULT PO), Take 1 tablet by mouth daily., Disp: , Rfl:    SUMAtriptan (IMITREX) 100 MG tablet, Take by mouth., Disp: , Rfl:    Vitamin D, Ergocalciferol, (DRISDOL) 1.25 MG (50000 UNIT) CAPS capsule, Take by mouth., Disp: , Rfl:    WEGOVY 2.4 MG/0.75ML SOAJ, Inject into the skin., Disp: , Rfl:  Medication Side Effects: none  Family Medical/ Social History: Changes? No  MENTAL HEALTH EXAM:  There were no vitals taken for this visit.There is no height or weight on file to calculate BMI.  General Appearance: Casual  Eye Contact:  Good  Speech:  Clear and Coherent  Volume:  Normal  Mood:  NA  Affect:  Appropriate  Thought Process:  Coherent  Orientation:  Full (Time, Place, and Person)  Thought Content: Logical   Suicidal Thoughts:  No  Homicidal Thoughts:  No  Memory:  WNL  Judgement:  NA  Insight:  Good  Psychomotor Activity:  Normal  Concentration:  Concentration: Good  Recall:  Good  Fund of Knowledge: Good  Language: Good  Assets:  Desire for Improvement  ADL's:  Intact  Cognition: WNL  Prognosis:  Good    DIAGNOSES: No diagnosis found.  Receiving Psychotherapy: No    RECOMMENDATIONS:   Greater than 50% of 45 min  face  to face time with patient was spent on counseling and coordination of care. We discussed her hx of anxiety and depression as well as adjustment disorder and extended grief.  She agreed to: Continue Celexa 40 mg daily To report worsening symptoms promptly To follow up in 4 weeks to reassess Provided emergency contact information Reviewed PDMP.       Elwanda Brooklyn, NP      Elwanda Brooklyn, NP

## 2021-04-22 ENCOUNTER — Ambulatory Visit: Payer: BC Managed Care – PPO | Admitting: Psychiatry

## 2021-04-27 ENCOUNTER — Ambulatory Visit: Payer: BC Managed Care – PPO | Admitting: Psychiatry
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
## 2021-05-12 ENCOUNTER — Ambulatory Visit: Payer: BC Managed Care – PPO | Admitting: Behavioral Health

## 2021-10-24 IMAGING — CT CT ABD-PELV W/ CM
2 of 5 series · 16 of 46 positions shown, 18 images · IV contrast (APPLIED)
Comparison: None.

CLINICAL DATA: Left-sided abdominal pain. Nausea, vomiting,
diarrhea. History of gastric bypass.

EXAM:
CT ABDOMEN AND PELVIS WITH CONTRAST
TECHNIQUE: Multidetector CT imaging of the abdomen and pelvis was performed
using the standard protocol following bolus administration of
intravenous contrast.
CONTRAST:  100mL OMNIPAQUE IOHEXOL 300 MG/ML  SOLN

[Series 3: abdomen 5.0 · axial · 0.83mm/px · z∈[+800,+1236]mm · 13 of 101 slices shown, 15 images]
[im 7/101  soft-tissue]
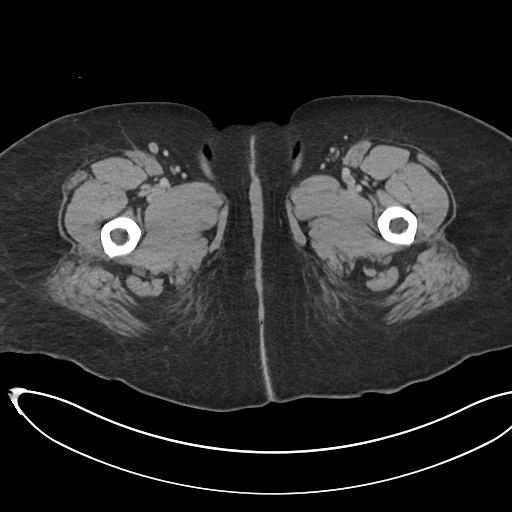
[im 7/101  bone]
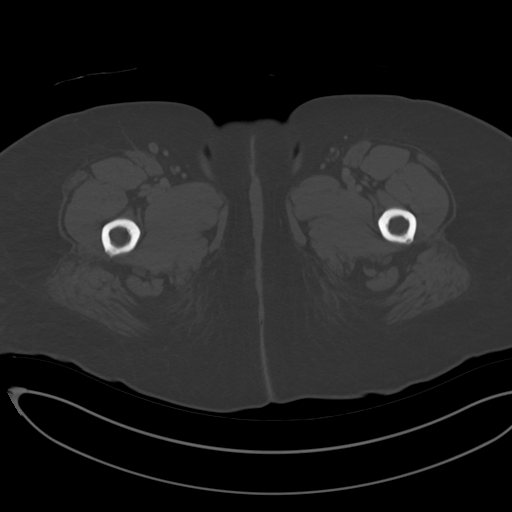
[im 14/101  soft-tissue]
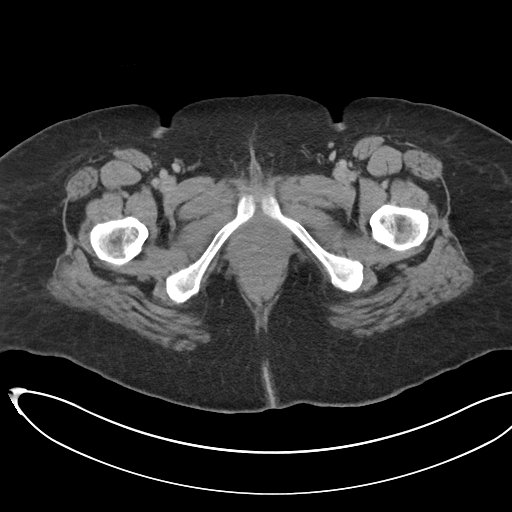
[im 21/101  soft-tissue]
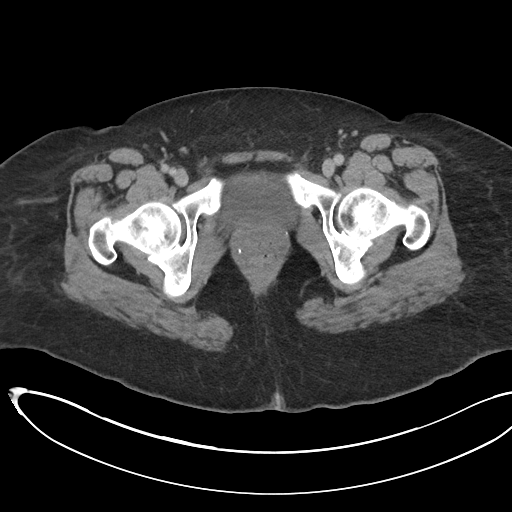
[im 27/101  soft-tissue]
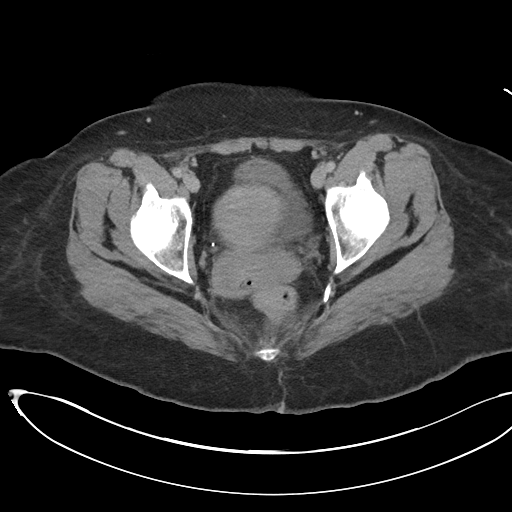
[im 34/101  soft-tissue]
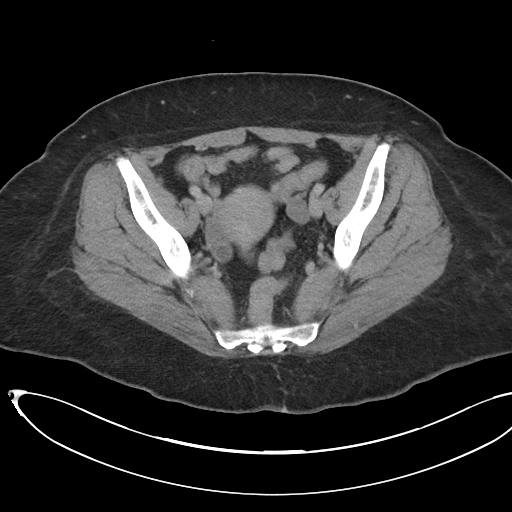
[im 41/101  soft-tissue]
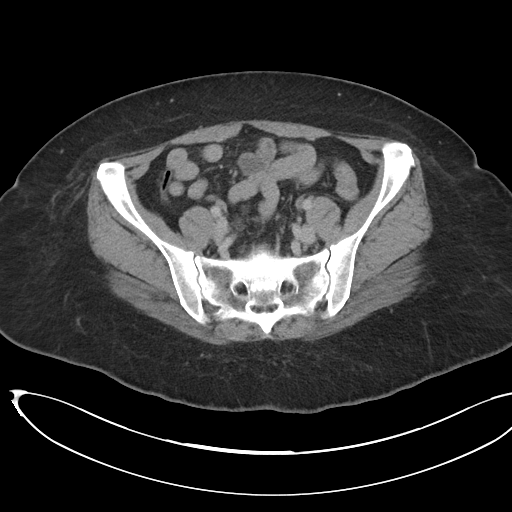
[im 54/101  soft-tissue]
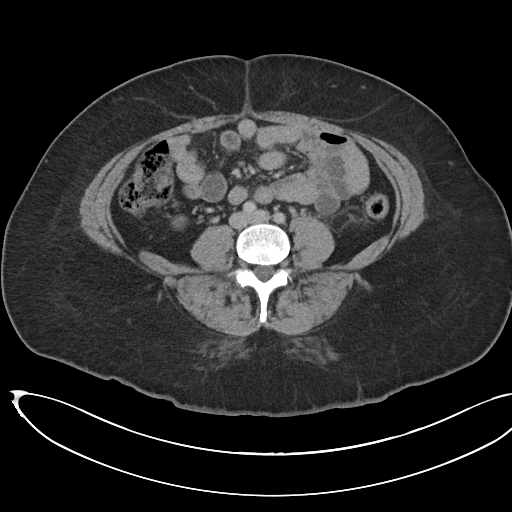
[im 61/101  soft-tissue]
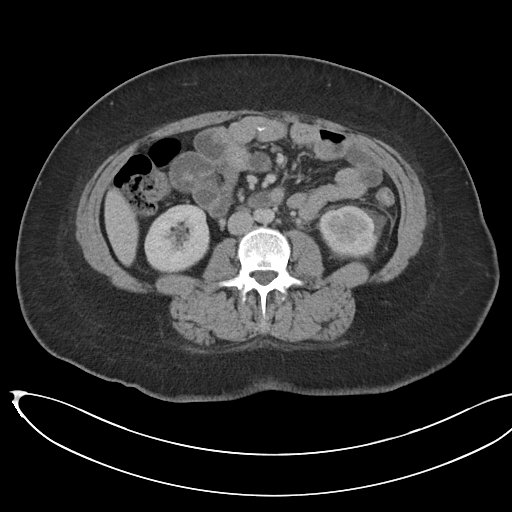
[im 67/101  soft-tissue]
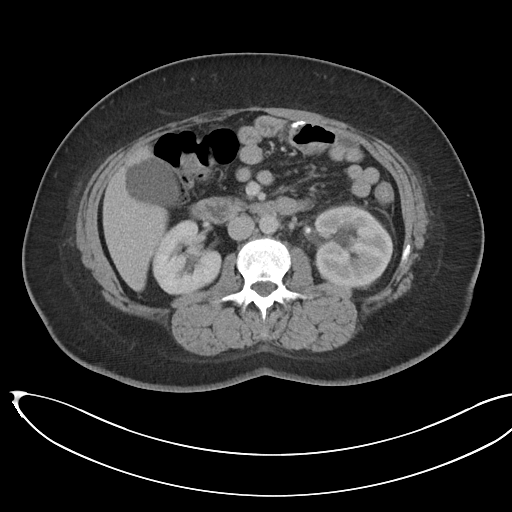
[im 67/101  bone]
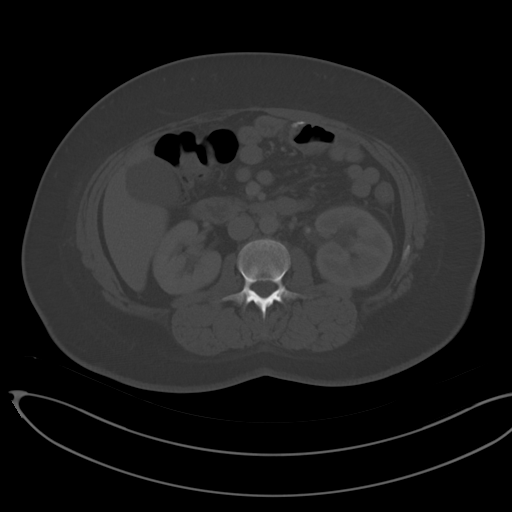
[im 74/101  soft-tissue]
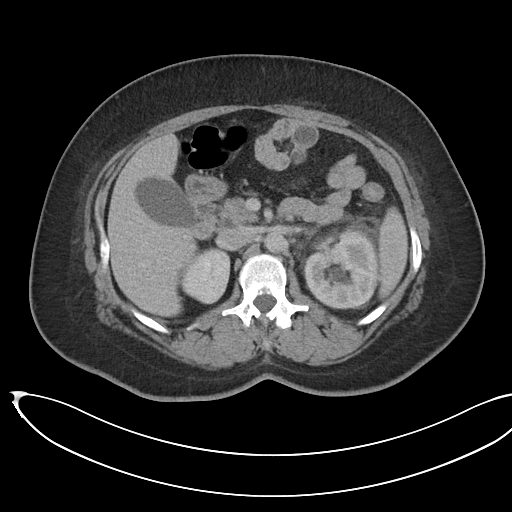
[im 81/101  soft-tissue]
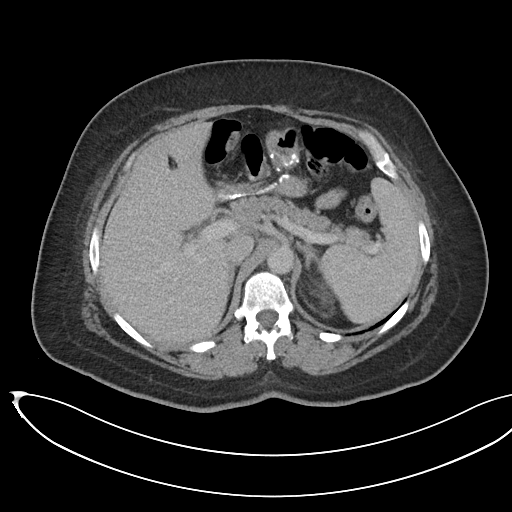
[im 87/101  soft-tissue]
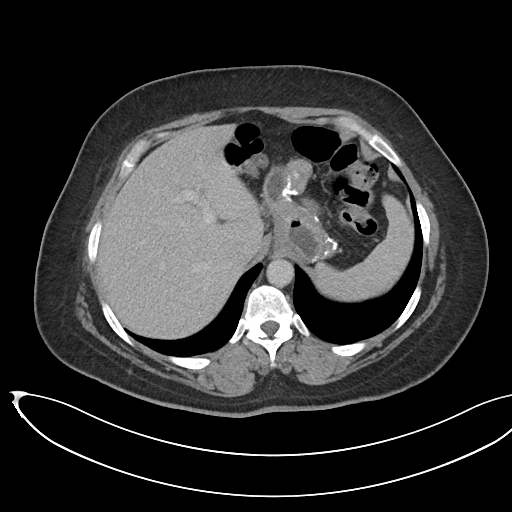
[im 94/101  soft-tissue]
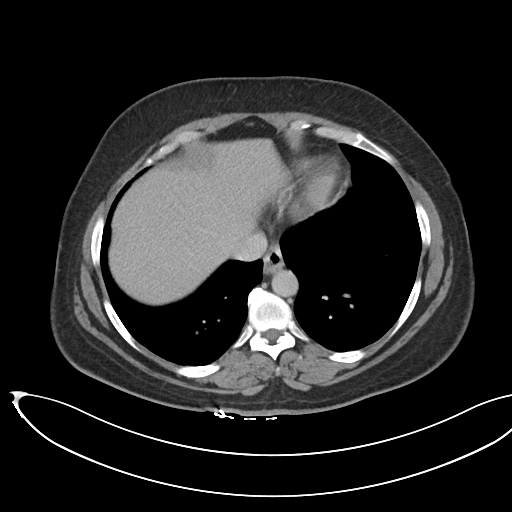

[Series 6: abdomen 3.0 mpr cor · coronal · 0.93mm/px · 3 of 110 slices shown]
[im 37/110  soft-tissue]
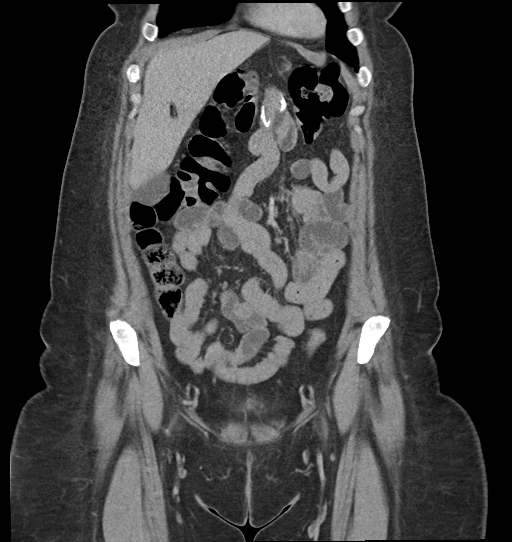
[im 49/110  soft-tissue]
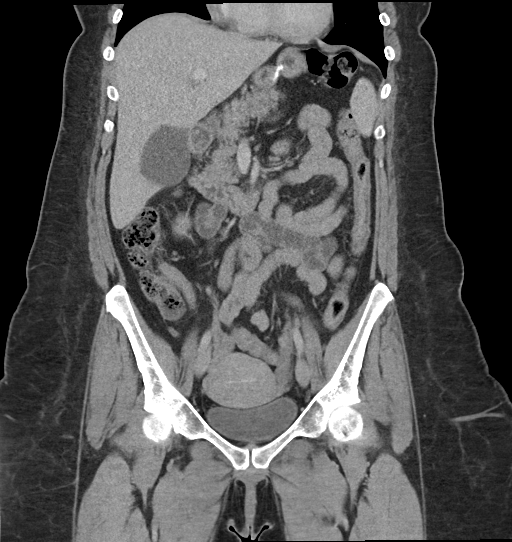
[im 61/110  soft-tissue]
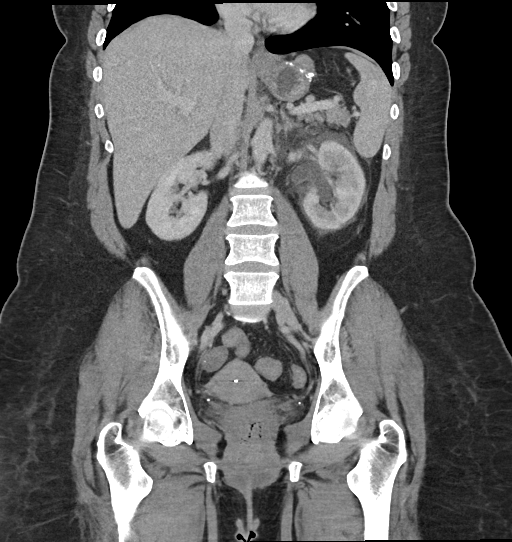

[16 of 46 positions shown; findings below may reference images not displayed]

FINDINGS: Lower chest: Minimal dependent opacities in both lung bases favoring
atelectasis. No pleural fluid. Heart is normal in size.

Hepatobiliary: No focal liver abnormality is seen. No gallstones,
gallbladder wall thickening, or biliary dilatation.

Pancreas: No ductal dilatation or inflammation.

Spleen: Normal in size without focal abnormality.

Adrenals/Urinary Tract: Adrenal glands.

Obstructing 9 x 11 mm stone in the left proximal ureter just distal
to the ureteropelvic junction with moderate left hydronephrosis and
perinephric edema. Small amount of left perinephric fluid. More
distal ureter is decompressed. Absent left excretion on delayed
phase imaging. Homogeneous enhancement of the right kidney without
right hydronephrosis. No evidence of additional nonobstructing
calculi in either kidney. Urinary bladder is partially distended, no
bladder wall thickening or stone.

Stomach/Bowel: Gastric bypass anatomy. Roux limb is nondilated.
Excluded gastric remnant is decompressed. No bowel obstruction or
inflammation. Small volume of colonic stool. Normal appendix.

Vascular/Lymphatic: Abdominal aorta is normal in caliber. The portal
vein is patent. No acute vascular findings. No enlarged lymph nodes
in the abdomen or pelvis.

Reproductive: Intrauterine device in the uterus. Ovaries symmetric
in size. No adnexal mass.

Other: No free air or intra-abdominal abscess.

Musculoskeletal: There are no acute or suspicious osseous
abnormalities.
IMPRESSION: 1. Obstructing 9 x 11 mm stone in the left proximal ureter with
moderate hydronephrosis and perinephric edema.
2. Post gastric bypass without complication.
